# Patient Record
Sex: Female | Born: 1970 | Race: White | Hispanic: No | Marital: Married | Smoking: Never smoker
Health system: Southern US, Community
[De-identification: ages and names within clinical notes are randomized; demographics above are authoritative.]

## PROBLEM LIST (undated history)

## (undated) DIAGNOSIS — E079 Disorder of thyroid, unspecified: Secondary | ICD-10-CM

## (undated) DIAGNOSIS — R87619 Unspecified abnormal cytological findings in specimens from cervix uteri: Secondary | ICD-10-CM

## (undated) HISTORY — PX: INTRAUTERINE DEVICE INSERTION: SHX323

## (undated) HISTORY — DX: Unspecified abnormal cytological findings in specimens from cervix uteri: R87.619

## (undated) HISTORY — DX: Disorder of thyroid, unspecified: E07.9

---

## 2007-04-17 ENCOUNTER — Other Ambulatory Visit: Admission: RE | Admit: 2007-04-17 | Discharge: 2007-04-17 | Payer: Self-pay | Admitting: Obstetrics & Gynecology

## 2008-04-30 ENCOUNTER — Other Ambulatory Visit: Admission: RE | Admit: 2008-04-30 | Discharge: 2008-04-30 | Payer: Self-pay | Admitting: Obstetrics & Gynecology

## 2011-07-09 ENCOUNTER — Other Ambulatory Visit: Payer: Self-pay | Admitting: Certified Nurse Midwife

## 2011-07-09 DIAGNOSIS — E041 Nontoxic single thyroid nodule: Secondary | ICD-10-CM

## 2011-07-14 ENCOUNTER — Other Ambulatory Visit: Payer: Self-pay

## 2011-07-15 ENCOUNTER — Ambulatory Visit
Admission: RE | Admit: 2011-07-15 | Discharge: 2011-07-15 | Disposition: A | Discharge status: Home / Self Care | Payer: BC Managed Care – PPO | Source: Ambulatory Visit | Attending: Certified Nurse Midwife | Admitting: Certified Nurse Midwife

## 2011-07-15 DIAGNOSIS — E041 Nontoxic single thyroid nodule: Secondary | ICD-10-CM

## 2012-07-12 ENCOUNTER — Ambulatory Visit: Payer: Self-pay | Admitting: Certified Nurse Midwife

## 2012-07-24 ENCOUNTER — Other Ambulatory Visit: Payer: Self-pay | Admitting: Certified Nurse Midwife

## 2012-09-11 ENCOUNTER — Encounter: Payer: Self-pay | Admitting: Certified Nurse Midwife

## 2012-09-12 ENCOUNTER — Ambulatory Visit (INDEPENDENT_AMBULATORY_CARE_PROVIDER_SITE_OTHER): Payer: BC Managed Care – PPO | Admitting: Certified Nurse Midwife

## 2012-09-12 ENCOUNTER — Encounter: Payer: Self-pay | Admitting: Certified Nurse Midwife

## 2012-09-12 VITALS — BP 104/64 | HR 64 | Resp 16 | Ht 61.25 in | Wt 183.0 lb

## 2012-09-12 DIAGNOSIS — Z0000 Encounter for general adult medical examination without abnormal findings: Secondary | ICD-10-CM

## 2012-09-12 DIAGNOSIS — E039 Hypothyroidism, unspecified: Secondary | ICD-10-CM

## 2012-09-12 DIAGNOSIS — R6889 Other general symptoms and signs: Secondary | ICD-10-CM

## 2012-09-12 DIAGNOSIS — Z01419 Encounter for gynecological examination (general) (routine) without abnormal findings: Secondary | ICD-10-CM

## 2012-09-12 LAB — CBC
MCH: 28.6 pg (ref 26.0–34.0)
MCHC: 33.4 g/dL (ref 30.0–36.0)
Platelets: 239 10*3/uL (ref 150–400)
RBC: 4.61 MIL/uL (ref 3.87–5.11)
RDW: 14.1 % (ref 11.5–15.5)

## 2012-09-12 LAB — COMPREHENSIVE METABOLIC PANEL
AST: 19 U/L (ref 0–37)
Albumin: 4.4 g/dL (ref 3.5–5.2)
Alkaline Phosphatase: 100 U/L (ref 39–117)
BUN: 8 mg/dL (ref 6–23)
Calcium: 9.3 mg/dL (ref 8.4–10.5)
Chloride: 105 mEq/L (ref 96–112)
Creat: 1.05 mg/dL (ref 0.50–1.10)
Glucose, Bld: 72 mg/dL (ref 70–99)
Potassium: 4.6 mEq/L (ref 3.5–5.3)

## 2012-09-12 LAB — POCT URINALYSIS DIPSTICK
Bilirubin, UA: NEGATIVE
Leukocytes, UA: NEGATIVE
Nitrite, UA: NEGATIVE
Protein, UA: NEGATIVE
Urobilinogen, UA: NEGATIVE
pH, UA: 5

## 2012-09-12 LAB — LIPID PANEL
HDL: 36 mg/dL — ABNORMAL LOW (ref >39)
Total CHOL/HDL Ratio: 4 Ratio
Triglycerides: 119 mg/dL (ref <150)

## 2012-09-12 NOTE — Progress Notes (Signed)
42 y.o. G24P1001 Married Caucasian Fe here for annual exam.    No LMP recorded.          Sexually active: yes  The current method of family planning is IUD.    Exercising: no  exercising Smoker:  no  Health Maintenance: Pap: 06/29/11 neg HPV HR neg MMG:  06/28/11 Colonoscopy:  none BMD:   none TDaP:  2005 Labs: Poct urine-neg Self breast exam: occ     No past medical history on file.  Past Surgical History  Procedure Laterality Date  . Intrauterine device insertion      inserted 02/02/11, removal 02/01/21    Current Outpatient Prescriptions  Medication Sig Dispense Refill  . levothyroxine (SYNTHROID, LEVOTHROID) 50 MCG tablet TAKE 1 TABLET EVERY DAY  30 tablet  4  . loratadine (CLARITIN) 10 MG tablet Take 10 mg by mouth daily.      Marland Kitchen PARAGARD INTRAUTERINE COPPER IU by Intrauterine route.      . Pseudoephedrine HCl (SUDAFED PO) Take by mouth as needed.       No current facility-administered medications for this visit.    Family History  Problem Relation Age of Onset  . Breast cancer Mother   . Heart disease Mother   . Heart disease Father   . Breast cancer Maternal Aunt   . Heart disease Maternal Grandmother     ROS:  Pertinent items are noted in HPI.  Otherwise, a comprehensive ROS was negative.  Exam:   There were no vitals taken for this visit.    Ht Readings from Last 3 Encounters:  No data found for Ht    General appearance: alert, cooperative and appears stated age Head: Normocephalic, without obvious abnormality, atraumatic Neck: no adenopathy, supple, symmetrical, trachea midline and thyroid normal to inspection and palpation, small known nodule noted on Left no change Lungs: clear to auscultation bilaterally Breasts: normal appearance, no masses or tenderness, No nipple retraction or dimpling, No nipple discharge or bleeding, No axillary or supraclavicular adenopathy Heart: regular rate and rhythm Abdomen: soft, non-tender; no masses,  no  organomegaly Extremities: extremities normal, atraumatic, no cyanosis or edema Skin: Skin color, texture, turgor normal. No rashes or lesions Lymph nodes: Cervical, supraclavicular, and axillary nodes normal. No abnormal inguinal nodes palpated Neurologic: Grossly normal   Pelvic: External genitalia:  no lesions              Urethra:  normal appearing urethra with no masses, tenderness or lesions              Bartholin's and Skene's: normal                 Vagina: normal appearing vagina with normal color and discharge, no lesions              Cervix: normal, nontender, IUD string noted              Pap taken: no Bimanual Exam:  Uterus:  normal size, contour, position, consistency, mobility, non-tender and mid position              Adnexa: normal adnexa and no mass, fullness, tenderness               Rectovaginal: Confirms               Anus:  normal sphincter tone, no lesions  A:  Well Woman with normal exam  Contraception: Paragard IUD  Hypothyroid stable medication, Left thyroid nodule, no size change from  previous exam  P:  Reviewed health and wellness pertinent to exam  Aware of warning signs and symptoms with IUD  Has Rx will hold on refill until TSH results in   Labs: Lipid panel, CMP, CBC  Pap smear as per guidelines   Mammogram yearly pap smear not taken today  counseled on breast self exam, mammography screening, adequate intake of calcium and vitamin D, diet and exercise  return annually or prn  An After Visit Summary was printed and given to the patient.  Reviewed, TL

## 2012-09-12 NOTE — Patient Instructions (Addendum)

## 2012-09-13 ENCOUNTER — Other Ambulatory Visit: Payer: Self-pay | Admitting: Certified Nurse Midwife

## 2012-09-13 ENCOUNTER — Telehealth: Payer: Self-pay

## 2012-09-13 DIAGNOSIS — E039 Hypothyroidism, unspecified: Secondary | ICD-10-CM

## 2012-09-13 DIAGNOSIS — R6889 Other general symptoms and signs: Secondary | ICD-10-CM

## 2012-09-13 NOTE — Telephone Encounter (Signed)
Lmtcb//jj 

## 2012-09-13 NOTE — Progress Notes (Deleted)
I have put them in twice and didn't hold, just repeat them in again , will recheck

## 2012-09-13 NOTE — Telephone Encounter (Signed)
Patient notified

## 2012-09-13 NOTE — Telephone Encounter (Signed)
Message copied by Eliezer Bottom on Wed Sep 13, 2012 10:32 AM ------      Message from: Verner Chol      Created: Wed Sep 13, 2012  8:02 AM       Notify that CBC is essentially normal but white count just slightly elevated, could be viral response re check with TSH unless becomes symptomatic with illness signs in one month      TSH is elevated, not unusual with age changes, need to increase Synthroid to 75 mcg daily, if she still has some 50 mcg can do one and 1/2 until finishes that Rx, If not Rx Synthroid 75 mcg #30 one refill recheck in one month      Order for lab only in      Liver, kidney, glucose profile normal      Lipid panel all normal except  HDL( good cholesterol low)   Work on weight loss and diet and this will improve the cardiac ratio profile ------

## 2012-09-13 NOTE — Telephone Encounter (Signed)
Pt returning your call

## 2012-09-26 ENCOUNTER — Telehealth: Payer: Self-pay | Admitting: Certified Nurse Midwife

## 2012-09-26 MED ORDER — LEVOTHYROXINE SODIUM 75 MCG PO TABS: ORAL_TABLET | ORAL | Status: DC

## 2012-09-26 NOTE — Telephone Encounter (Signed)
rx sent thru for 1 mth with no refills. Patient has lab appt for recheck on 10/13/12

## 2012-09-26 NOTE — Telephone Encounter (Signed)
Patient needs have her levothyroxine called in and the dosage changed to 75mg .

## 2012-09-26 NOTE — Telephone Encounter (Signed)
Patient notified

## 2012-10-12 ENCOUNTER — Other Ambulatory Visit: Payer: BC Managed Care – PPO | Admitting: Certified Nurse Midwife

## 2012-10-12 ENCOUNTER — Ambulatory Visit (INDEPENDENT_AMBULATORY_CARE_PROVIDER_SITE_OTHER): Payer: BC Managed Care – PPO | Admitting: Certified Nurse Midwife

## 2012-10-12 DIAGNOSIS — R6889 Other general symptoms and signs: Secondary | ICD-10-CM

## 2012-10-12 DIAGNOSIS — E039 Hypothyroidism, unspecified: Secondary | ICD-10-CM

## 2012-10-13 ENCOUNTER — Other Ambulatory Visit: Payer: BC Managed Care – PPO

## 2012-10-13 LAB — TSH: TSH: 2.003 u[IU]/mL (ref 0.350–4.500)

## 2012-10-13 LAB — CBC
HCT: 38.7 % (ref 36.0–46.0)
Hemoglobin: 12.7 g/dL (ref 12.0–15.0)
MCH: 28.8 pg (ref 26.0–34.0)
MCHC: 32.8 g/dL (ref 30.0–36.0)
RDW: 14.4 % (ref 11.5–15.5)

## 2012-10-17 ENCOUNTER — Other Ambulatory Visit: Payer: Self-pay | Admitting: Certified Nurse Midwife

## 2012-10-17 ENCOUNTER — Telehealth: Payer: Self-pay

## 2012-10-17 DIAGNOSIS — R6889 Other general symptoms and signs: Secondary | ICD-10-CM

## 2012-10-17 DIAGNOSIS — E039 Hypothyroidism, unspecified: Secondary | ICD-10-CM

## 2012-10-17 MED ORDER — LEVOTHYROXINE SODIUM 75 MCG PO TABS: ORAL_TABLET | ORAL | Status: DC

## 2012-10-17 NOTE — Telephone Encounter (Signed)
Message copied by Elisha Headland on Tue Oct 17, 2012 10:01 AM ------      Message from: Leah Estrada      Created: Tue Oct 17, 2012  7:38 AM       Notify patient Synthroid 75 mcg appears to be working well . TSH good range now. (does she need refill?)      CBC essentially normal      Repeat TSH and CBC with diff in 2 months  Order in ------

## 2012-10-17 NOTE — Telephone Encounter (Signed)
7/22 lmtcb//kn

## 2012-10-17 NOTE — Telephone Encounter (Signed)
Patient notified of all results. Will call back to schedule recheck of labs, she needs to check her work schedule. Will need refills of synthroid-rx for #30/1RF sent to [facility]. This will last her until her f/u labs.

## 2012-12-24 ENCOUNTER — Other Ambulatory Visit: Payer: Self-pay | Admitting: Certified Nurse Midwife

## 2012-12-25 ENCOUNTER — Other Ambulatory Visit (INDEPENDENT_AMBULATORY_CARE_PROVIDER_SITE_OTHER): Payer: BC Managed Care – PPO

## 2012-12-25 DIAGNOSIS — E039 Hypothyroidism, unspecified: Secondary | ICD-10-CM

## 2012-12-25 DIAGNOSIS — R6889 Other general symptoms and signs: Secondary | ICD-10-CM

## 2012-12-25 LAB — TSH: TSH: 1.848 u[IU]/mL (ref 0.350–4.500)

## 2012-12-25 LAB — CBC WITH DIFFERENTIAL/PLATELET
Basophils Absolute: 0 10*3/uL (ref 0.0–0.1)
Basophils Relative: 0 % (ref 0–1)
HCT: 38.6 % (ref 36.0–46.0)
Hemoglobin: 12.9 g/dL (ref 12.0–15.0)
Lymphocytes Relative: 29 % (ref 12–46)
Monocytes Absolute: 0.6 10*3/uL (ref 0.1–1.0)
Monocytes Relative: 7 % (ref 3–12)
Neutro Abs: 5.4 10*3/uL (ref 1.7–7.7)
Neutrophils Relative %: 60 % (ref 43–77)
WBC: 9 10*3/uL (ref 4.0–10.5)

## 2012-12-26 ENCOUNTER — Other Ambulatory Visit: Payer: Self-pay | Admitting: Certified Nurse Midwife

## 2012-12-26 DIAGNOSIS — E039 Hypothyroidism, unspecified: Secondary | ICD-10-CM

## 2012-12-27 ENCOUNTER — Other Ambulatory Visit: Payer: Self-pay | Admitting: Certified Nurse Midwife

## 2012-12-27 DIAGNOSIS — E039 Hypothyroidism, unspecified: Secondary | ICD-10-CM

## 2012-12-27 MED ORDER — LEVOTHYROXINE SODIUM 75 MCG PO TABS: ORAL_TABLET | ORAL | Status: DC

## 2013-01-29 IMAGING — US US SOFT TISSUE HEAD/NECK
1 series · 14 of 25 positions shown · non-contrast
Comparison: None.

CLINICAL DATA: Left thyroid nodule, hypothyroid

THYROID ULTRASOUND
TECHNIQUE: Ultrasound examination of the thyroid gland and adjacent
soft tissues was performed.

[Series 1: us soft tissue head/neck · 0.08mm/px · 14 of 53 slices shown]
[im 1/53]
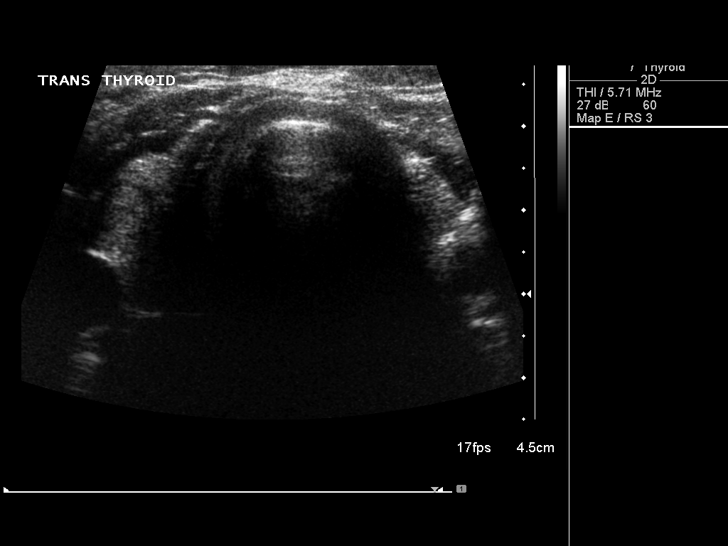
[im 5/53]
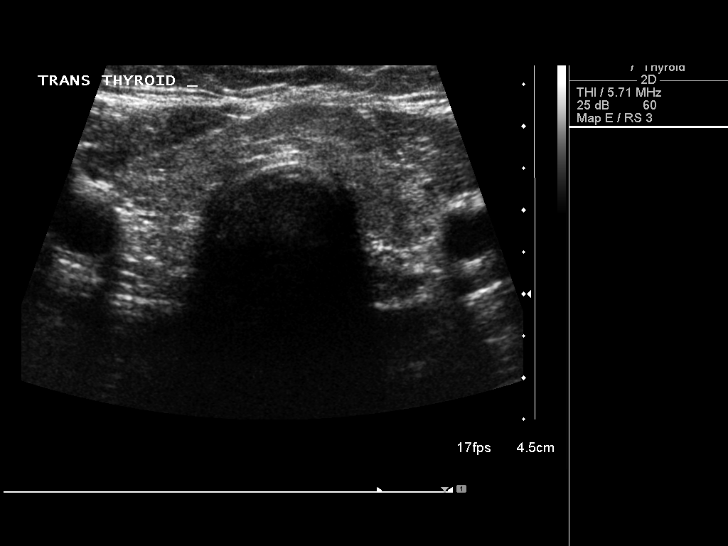
[im 9/53]
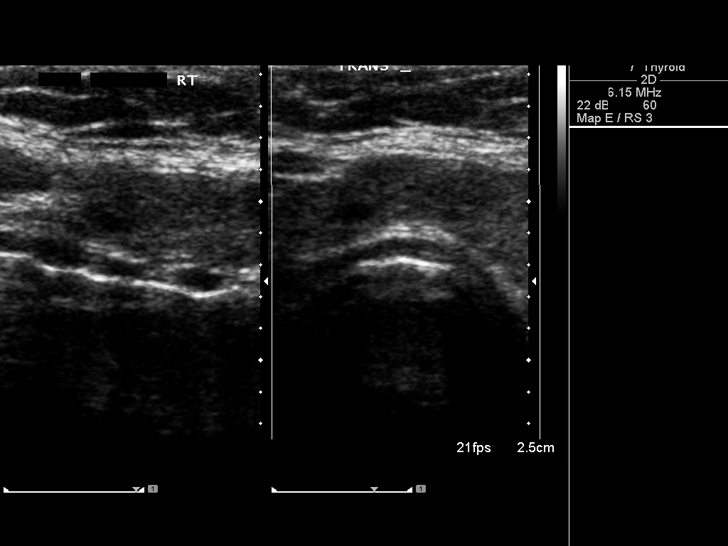
[im 14/53]
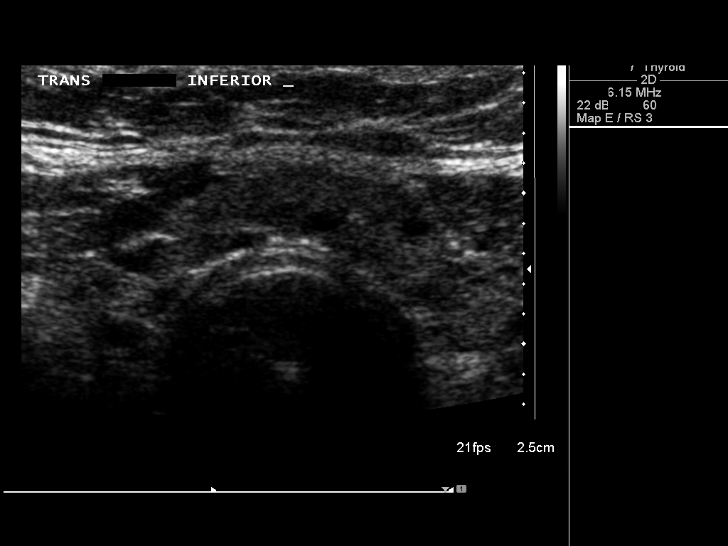
[im 18/53]
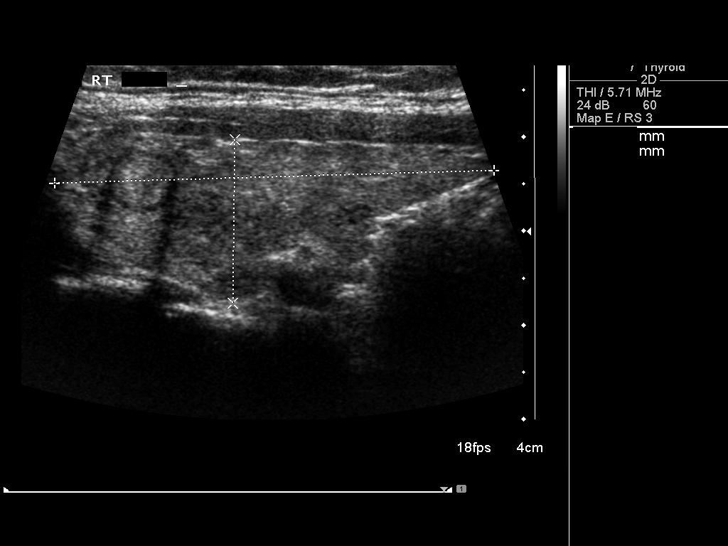
[im 20/53]
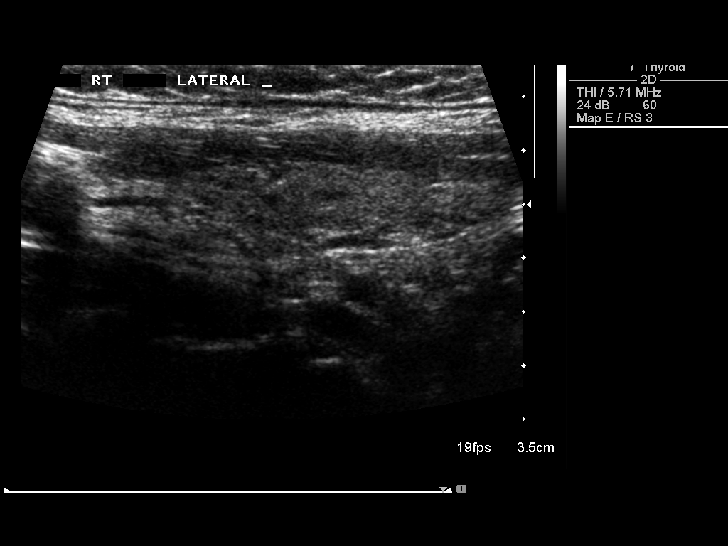
[im 24/53]
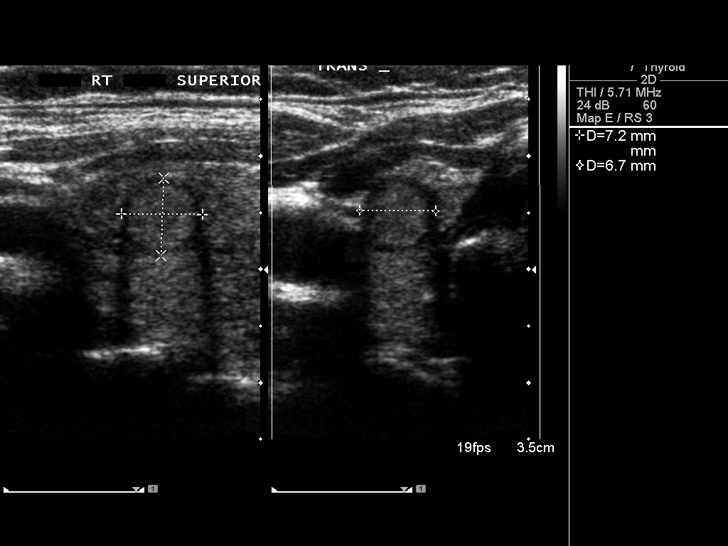
[im 29/53]
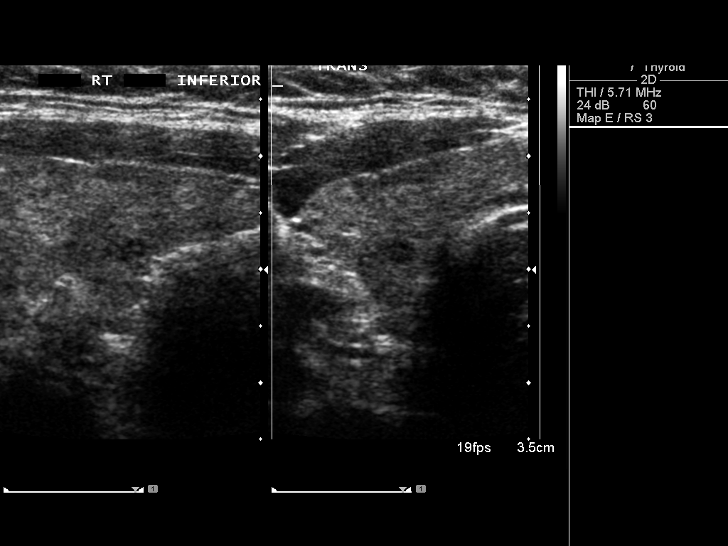
[im 33/53]
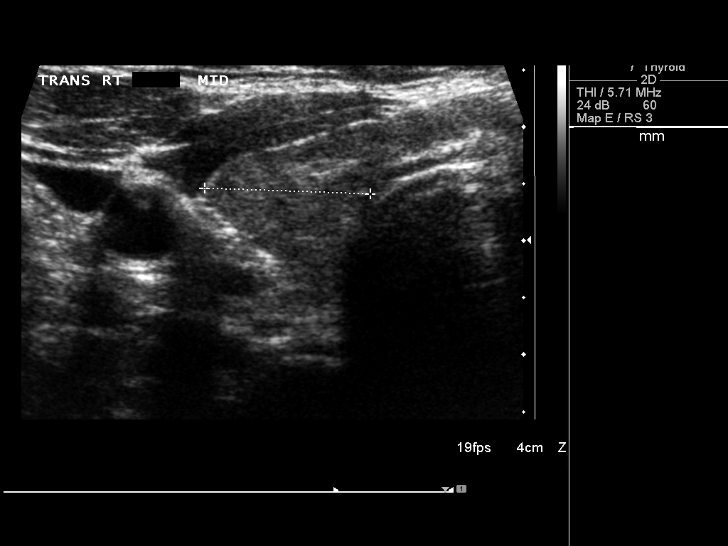
[im 35/53]
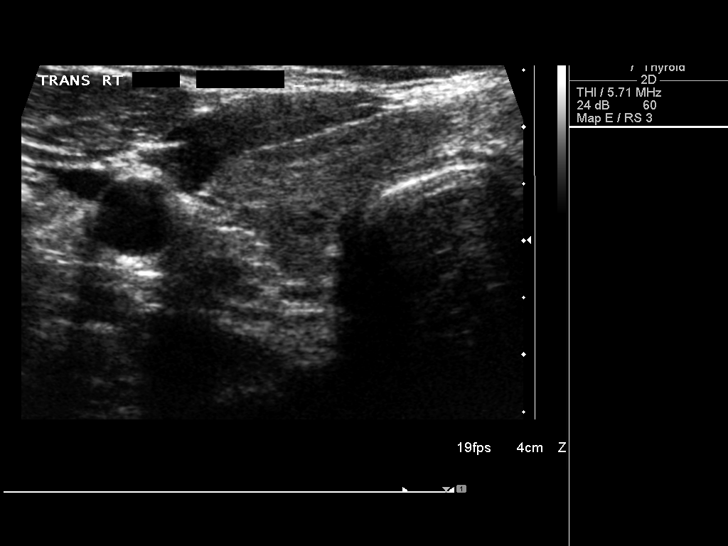
[im 40/53]
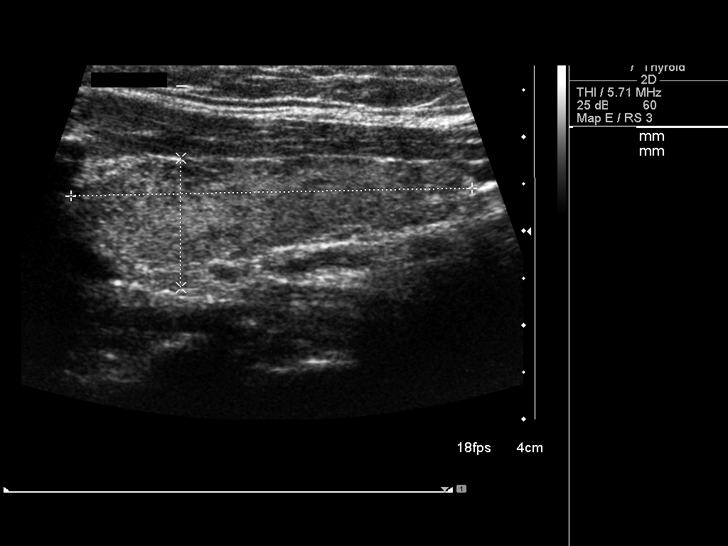
[im 44/53]
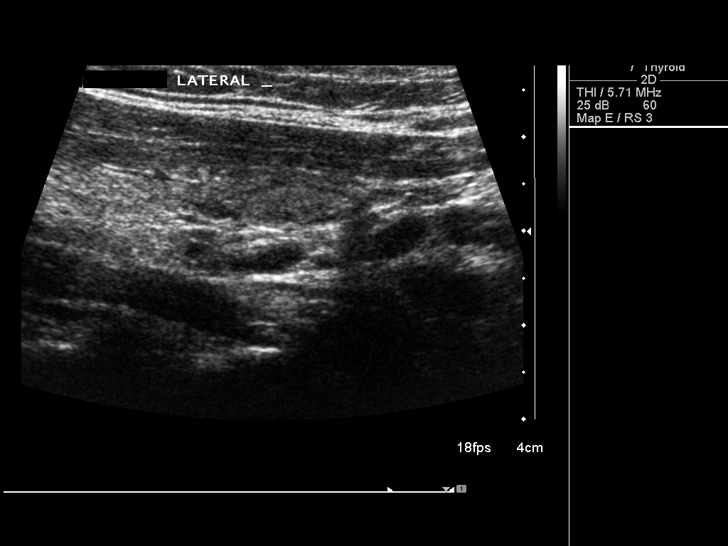
[im 48/53]
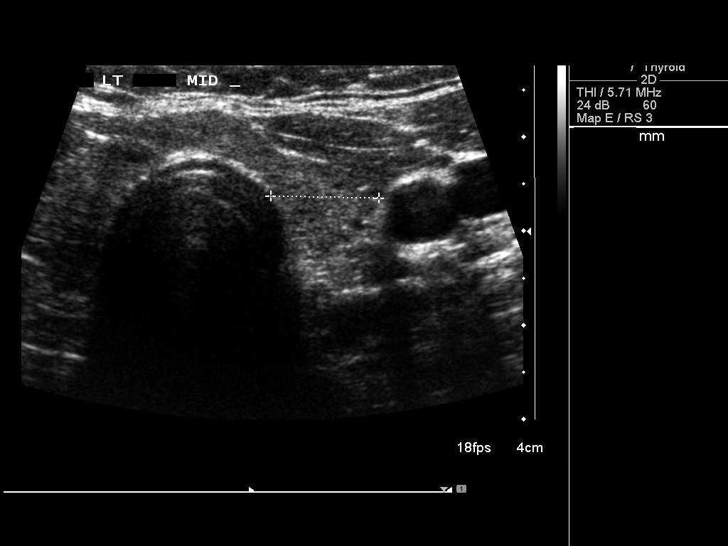
[im 53/53]
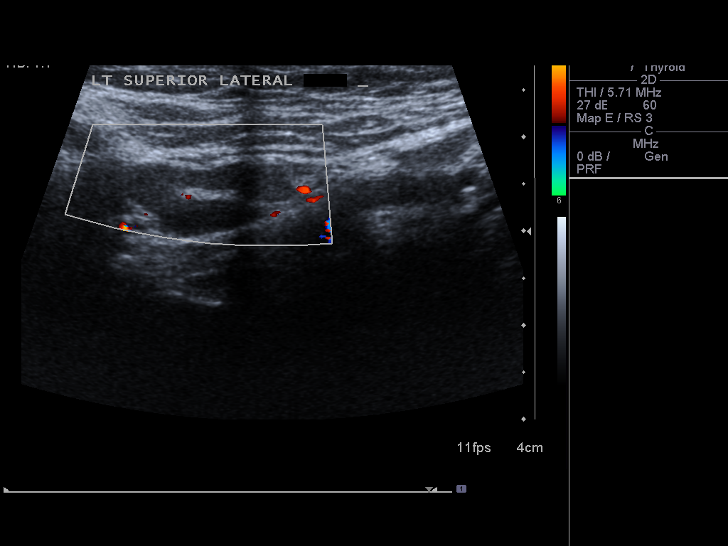

[14 of 25 positions shown; findings below may reference images not displayed]

FINDINGS: Right thyroid lobe:  4.7 x 1.7 x 1.5 cm.
Left thyroid lobe:  4.3 x 1.4 x 1.2 cm.
Isthmus:  6 mm in thickness.

Focal nodules:  The echogenicity of the thyroid gland is slightly
inhomogeneous.  There are nodules particularly in the right upper
lobe of no more than 10 mm in diameter.  Small nodules are present
bilaterally of 4 mm or less in size.

Lymphadenopathy:  None visualized.
IMPRESSION: There are multiple small thyroid nodules the largest of 10 mm in
the right upper lobe.  The gland is normal in size.

## 2013-02-21 ENCOUNTER — Other Ambulatory Visit (INDEPENDENT_AMBULATORY_CARE_PROVIDER_SITE_OTHER): Payer: BC Managed Care – PPO

## 2013-02-21 DIAGNOSIS — E039 Hypothyroidism, unspecified: Secondary | ICD-10-CM

## 2013-02-21 LAB — TSH: TSH: 1.863 u[IU]/mL (ref 0.350–4.500)

## 2013-02-23 ENCOUNTER — Other Ambulatory Visit: Payer: Self-pay | Admitting: Certified Nurse Midwife

## 2013-02-23 DIAGNOSIS — E039 Hypothyroidism, unspecified: Secondary | ICD-10-CM

## 2013-02-23 MED ORDER — LEVOTHYROXINE SODIUM 75 MCG PO TABS: ORAL_TABLET | ORAL | Status: DC

## 2013-02-27 ENCOUNTER — Telehealth: Payer: Self-pay

## 2013-02-27 NOTE — Telephone Encounter (Signed)
Message copied by Eliezer Bottom on Tue Feb 27, 2013  2:29 PM ------      Message from: Verner Chol      Created: Fri Feb 23, 2013 12:06 PM       Notify TSH looks great on current dose, stable now will renew dose order in      Re check at [facility] ------

## 2013-02-27 NOTE — Telephone Encounter (Signed)
Patient notified of results.

## 2013-02-27 NOTE — Telephone Encounter (Signed)
lmtcb

## 2013-02-27 NOTE — Telephone Encounter (Signed)
Returning a call to Joy. °

## 2013-08-14 ENCOUNTER — Encounter: Payer: Self-pay | Admitting: Certified Nurse Midwife

## 2013-09-13 ENCOUNTER — Ambulatory Visit: Payer: BC Managed Care – PPO | Admitting: Certified Nurse Midwife

## 2013-09-25 ENCOUNTER — Ambulatory Visit (INDEPENDENT_AMBULATORY_CARE_PROVIDER_SITE_OTHER): Payer: BC Managed Care – PPO | Admitting: Certified Nurse Midwife

## 2013-09-25 ENCOUNTER — Encounter: Payer: Self-pay | Admitting: Certified Nurse Midwife

## 2013-09-25 ENCOUNTER — Other Ambulatory Visit: Payer: Self-pay | Admitting: Certified Nurse Midwife

## 2013-09-25 VITALS — BP 110/70 | HR 68 | Resp 16 | Ht 61.0 in | Wt 187.0 lb

## 2013-09-25 DIAGNOSIS — Z0000 Encounter for general adult medical examination without abnormal findings: Secondary | ICD-10-CM

## 2013-09-25 DIAGNOSIS — Z01419 Encounter for gynecological examination (general) (routine) without abnormal findings: Secondary | ICD-10-CM

## 2013-09-25 DIAGNOSIS — L723 Sebaceous cyst: Secondary | ICD-10-CM

## 2013-09-25 DIAGNOSIS — Z124 Encounter for screening for malignant neoplasm of cervix: Secondary | ICD-10-CM

## 2013-09-25 DIAGNOSIS — E039 Hypothyroidism, unspecified: Secondary | ICD-10-CM

## 2013-09-25 LAB — POCT URINALYSIS DIPSTICK
Bilirubin, UA: NEGATIVE
GLUCOSE UA: NEGATIVE
Ketones, UA: NEGATIVE
Leukocytes, UA: NEGATIVE
Nitrite, UA: NEGATIVE
Protein, UA: NEGATIVE
RBC UA: NEGATIVE
UROBILINOGEN UA: NEGATIVE
pH, UA: 5

## 2013-09-25 NOTE — Patient Instructions (Signed)
General topics  Next pap or exam is  due in 1 year Take a Women's multivitamin Take 1200 mg. of calcium daily - prefer dietary If any concerns in interim to call back  Breast Self-Awareness Practicing breast self-awareness may pick up problems early, prevent significant medical complications, and possibly save your life. By practicing breast self-awareness, you can become familiar with how your breasts look and feel and if your breasts are changing. This allows you to notice changes early. It can also offer you some reassurance that your breast health is good. One way to learn what is normal for your breasts and whether your breasts are changing is to do a breast self-exam. If you find a lump or something that was not present in the past, it is best to contact your caregiver right away. Other findings that should be evaluated by your caregiver include nipple discharge, especially if it is bloody; skin changes or reddening; areas where the skin seems to be pulled in (retracted); or new lumps and bumps. Breast pain is seldom associated with cancer (malignancy), but should also be evaluated by a caregiver. BREAST SELF-EXAM The best time to examine your breasts is 5 7 days after your menstrual period is over.  ExitCare Patient Information 2013 [employer].   Exercise to Stay Healthy Exercise helps you become and stay healthy. EXERCISE IDEAS AND TIPS Choose exercises that:  You enjoy.  Fit into your day. You do not need to exercise really hard to be healthy. You can do exercises at a slow or medium level and stay healthy. You can:  Stretch before and after working out.  Try yoga, Pilates, or tai chi.  Lift weights.  Walk fast, swim, jog, run, climb stairs, bicycle, dance, or rollerskate.  Take aerobic classes. Exercises that burn about 150 calories:  Running 1  miles in 15 minutes.  Playing volleyball for 45 to 60 minutes.  Washing and waxing a car for 45 to 60  minutes.  Playing touch football for 45 minutes.  Walking 1  miles in 35 minutes.  Pushing a stroller 1  miles in 30 minutes.  Playing basketball for 30 minutes.  Raking leaves for 30 minutes.  Bicycling 5 miles in 30 minutes.  Walking 2 miles in 30 minutes.  Dancing for 30 minutes.  Shoveling snow for 15 minutes.  Swimming laps for 20 minutes.  Walking up stairs for 15 minutes.  Bicycling 4 miles in 15 minutes.  Gardening for 30 to 45 minutes.  Jumping rope for 15 minutes.  Washing windows or floors for 45 to 60 minutes. Document Released: 04/17/2010 Document Revised: 06/07/2011 Document Reviewed: 04/17/2010 ExitCare Patient Information 2013 [employer].   Other topics ( that may be useful information):    Sexually Transmitted Disease Sexually transmitted disease (STD) refers to any infection that is passed from person to person during sexual activity. This may happen by way of saliva, semen, blood, vaginal mucus, or urine. Common STDs include:  Gonorrhea.  Chlamydia.  Syphilis.  HIV/AIDS.  Genital herpes.  Hepatitis B and C.  Trichomonas.  Human papillomavirus (HPV).  Pubic lice. CAUSES  An STD may be spread by bacteria, virus, or parasite. A person can get an STD by:  Sexual intercourse with an infected person.  Sharing sex toys with an infected person.  Sharing needles with an infected person.  Having intimate contact with the genitals, mouth, or rectal areas of an infected person. SYMPTOMS  Some people may not have any symptoms, but   they can still pass the infection to others. Different STDs have different symptoms. Symptoms include:  Painful or bloody urination.  Pain in the pelvis, abdomen, vagina, anus, throat, or eyes.  Skin rash, itching, irritation, growths, or sores (lesions). These usually occur in the genital or anal area.  Abnormal vaginal discharge.  Penile discharge in men.  Soft, flesh-colored skin growths in the  genital or anal area.  Fever.  Pain or bleeding during sexual intercourse.  Swollen glands in the groin area.  Yellow skin and eyes (jaundice). This is seen with hepatitis. DIAGNOSIS  To make a diagnosis, your caregiver may:  Take a medical history.  Perform a physical exam.  Take a specimen (culture) to be examined.  Examine a sample of discharge under a microscope.  Perform blood test TREATMENT   Chlamydia, gonorrhea, trichomonas, and syphilis can be cured with antibiotic medicine.  Genital herpes, hepatitis, and HIV can be treated, but not cured, with prescribed medicines. The medicines will lessen the symptoms.  Genital warts from HPV can be treated with medicine or by freezing, burning (electrocautery), or surgery. Warts may come back.  HPV is a virus and cannot be cured with medicine or surgery.However, abnormal areas may be followed very closely by your caregiver and may be removed from the cervix, vagina, or vulva through office procedures or surgery. If your diagnosis is confirmed, your recent sexual partners need treatment. This is true even if they are symptom-free or have a negative culture or evaluation. They should not have sex until their caregiver says it is okay. HOME CARE INSTRUCTIONS  All sexual partners should be informed, tested, and treated for all STDs.  Take your antibiotics as directed. Finish them even if you start to feel better.  Only take over-the-counter or prescription medicines for pain, discomfort, or fever as directed by your caregiver.  Rest.  Eat a balanced diet and drink enough fluids to keep your urine clear or pale yellow.  Do not have sex until treatment is completed and you have followed up with your caregiver. STDs should be checked after treatment.  Keep all follow-up appointments, Pap tests, and blood tests as directed by your caregiver.  Only use latex condoms and water-soluble lubricants during sexual activity. Do not use  petroleum jelly or oils.  Avoid alcohol and illegal drugs.  Get vaccinated for HPV and hepatitis. If you have not received these vaccines in the past, talk to your caregiver about whether one or both might be right for you.  Avoid risky sex practices that can break the skin. The only way to avoid getting an STD is to avoid all sexual activity.Latex condoms and dental dams (for oral sex) will help lessen the risk of getting an STD, but will not completely eliminate the risk. SEEK MEDICAL CARE IF:   You have a fever.  You have any new or worsening symptoms. Document Released: 06/05/2002 Document Revised: 06/07/2011 Document Reviewed: 06/12/2010 [facility] Patient Information 2013 [employer].    Domestic Abuse You are being battered or abused if someone close to you hits, pushes, or physically hurts you in any way. You also are being abused if you are forced into activities. You are being sexually abused if you are forced to have sexual contact of any kind. You are being emotionally abused if you are made to feel worthless or if you are constantly threatened. It is important to remember that help is available. No one has the right to abuse you. PREVENTION OF FURTHER  ABUSE  Learn the warning signs of danger. This varies with situations but may include: the use of alcohol, threats, isolation from friends and family, or forced sexual contact. Leave if you feel that violence is going to occur.  If you are attacked or beaten, report it to the police so the abuse is documented. You do not have to press charges. The police can protect you while you or the attackers are leaving. Get the officer's name and badge number and a copy of the report.  Find someone you can trust and tell them what is happening to you: your caregiver, a nurse, clergy member, close friend or family member. Feeling ashamed is natural, but remember that you have done nothing wrong. No one deserves abuse. Document Released:  03/12/2000 Document Revised: 06/07/2011 Document Reviewed: 05/21/2010 ExitCare Patient Information 2013 [employer].    How Much is Too Much Alcohol? Drinking too much alcohol can cause injury, accidents, and health problems. These types of problems can include:   Car crashes.  Falls.  Family fighting (domestic violence).  Drowning.  Fights.  Injuries.  Burns.  Damage to certain organs.  Having a baby with birth defects. ONE DRINK CAN BE TOO MUCH WHEN YOU ARE:  Working.  Pregnant or breastfeeding.  Taking medicines. Ask your doctor.  Driving or planning to drive. If you or someone you know has a drinking problem, get help from a doctor.  Document Released: 01/09/2009 Document Revised: 06/07/2011 Document Reviewed: 01/09/2009 ExitCare Patient Information 2013 [employer].   Smoking Hazards Smoking cigarettes is extremely bad for your health. Tobacco smoke has over 200 known poisons in it. There are over 60 chemicals in tobacco smoke that cause cancer. Some of the chemicals found in cigarette smoke include:   Cyanide.  Benzene.  Formaldehyde.  Methanol (wood alcohol).  Acetylene (fuel used in welding torches).  Ammonia. Cigarette smoke also contains the poisonous gases nitrogen oxide and carbon monoxide.  Cigarette smokers have an increased risk of many serious medical problems and Smoking causes approximately:  90% of all lung cancer deaths in men.  80% of all lung cancer deaths in women.  90% of deaths from chronic obstructive lung disease. Compared with nonsmokers, smoking increases the risk of:  Coronary heart disease by 2 to 4 times.  Stroke by 2 to 4 times.  Men developing lung cancer by 23 times.  Women developing lung cancer by 13 times.  Dying from chronic obstructive lung diseases by 12 times.  . Smoking is the most preventable cause of death and disease in our society.  WHY IS SMOKING ADDICTIVE?  Nicotine is the chemical  agent in tobacco that is capable of causing addiction or dependence.  When you smoke and inhale, nicotine is absorbed rapidly into the bloodstream through your lungs. Nicotine absorbed through the lungs is capable of creating a powerful addiction. Both inhaled and non-inhaled nicotine may be addictive.  Addiction studies of cigarettes and spit tobacco show that addiction to nicotine occurs mainly during the teen years, when young people begin using tobacco products. WHAT ARE THE BENEFITS OF QUITTING?  There are many health benefits to quitting smoking.   Likelihood of developing cancer and heart disease decreases. Health improvements are seen almost immediately.  Blood pressure, pulse rate, and breathing patterns start returning to normal soon after quitting. QUITTING SMOKING   American Lung Association - 1-800-LUNGUSA  American Cancer Society - 1-800-ACS-2345 Document Released: 04/22/2004 Document Revised: 06/07/2011 Document Reviewed: 12/25/2008 ExitCare Patient Information 2013 ExitCare,   LL[employer].   Stress Management Stress is a state of physical or mental tension that often results from changes in your life or normal routine. Some common causes of stress are:  Death of a loved one.  Injuries or severe illnesses.  Getting fired or changing jobs.  Moving into a new home. Other causes may be:  Sexual problems.  Business or financial losses.  Taking on a large debt.  Regular conflict with someone at home or at work.  Constant tiredness from lack of sleep. It is not just bad things that are stressful. It may be stressful to:  Win the lottery.  Get married.  Buy a new car. The amount of stress that can be easily tolerated varies from person to person. Changes generally cause stress, regardless of the types of change. Too much stress can affect your health. It may lead to physical or emotional problems. Too little stress (boredom) may also become stressful. SUGGESTIONS TO  REDUCE STRESS:  Talk things over with your family and friends. It often is helpful to share your concerns and worries. If you feel your problem is serious, you may want to get help from a professional counselor.  Consider your problems one at a time instead of lumping them all together. Trying to take care of everything at once may seem impossible. List all the things you need to do and then start with the most important one. Set a goal to accomplish 2 or 3 things each day. If you expect to do too many in a single day you will naturally fail, causing you to feel even more stressed.  Do not use alcohol or drugs to relieve stress. Although you may feel better for a short time, they do not remove the problems that caused the stress. They can also be habit forming.  Exercise regularly - at least 3 times per week. Physical exercise can help to relieve that "uptight" feeling and will relax you.  The shortest distance between despair and hope is often a good night's sleep.  Go to bed and get up on time allowing yourself time for appointments without being rushed.  Take a short "time-out" period from any stressful situation that occurs during the day. Close your eyes and take some deep breaths. Starting with the muscles in your face, tense them, hold it for a few seconds, then relax. Repeat this with the muscles in your neck, shoulders, hand, stomach, back and legs.  Take good care of yourself. Eat a balanced diet and get plenty of rest.  Schedule time for having fun. Take a break from your daily routine to relax. HOME CARE INSTRUCTIONS   Call if you feel overwhelmed by your problems and feel you can no longer manage them on your own.  Return immediately if you feel like hurting yourself or someone else. Document Released: 09/08/2000 Document Revised: 06/07/2011 Document Reviewed: 05/01/2007 ExitCare Patient Information 2013 [employer].   

## 2013-09-25 NOTE — Progress Notes (Signed)
43 y.o. [id number] Married Caucasian Fe here for annual exam.  Periods normal, no issues. Contraception working well. Patient has been battling plantar fascitis and now has appointment with Podiatrist. No health issues in past year. Sees PCP prn only. Patient complaining of ? Ingrown hair on inner left thigh for several weeks, seems to resolve and reoccurs when she squeezes area. Has cleaned area with Peroxide with some decrease in occurrence. No fever or chills or drainage. Please check. Thyroid medication working well, no problems. No other health issues today.  Patient's last menstrual period was 09/02/2013.          Sexually active: Yes.    The current method of family planning is IUD.    Exercising: No.  exercise Smoker:  no  Health Maintenance: Pap: 06-29-11 neg HPV HR neg MMG:  09/25/13  Colonoscopy:  none BMD:   none TDaP:  2005 patient aware due Labs: Poct urine-neg Self breast exam:done occ   reports that she has never smoked. She does not have any smokeless tobacco history on file. She reports that she does not drink alcohol or use illicit drugs.  Past Medical History  Diagnosis Date  . Thyroid disease     hypothyroidism    Past Surgical History  Procedure Laterality Date  . Intrauterine device insertion      inserted 02/02/11, removal 02/01/21    Current Outpatient Prescriptions  Medication Sig Dispense Refill  . Fexofenadine HCl (ALLEGRA PO) Take by mouth daily.      Marland Kitchen. levothyroxine (SYNTHROID, LEVOTHROID) 75 MCG tablet TAKE 1 TABLET EVERY DAY  30 tablet  7  . PARAGARD INTRAUTERINE COPPER IU by Intrauterine route.      . Pseudoephedrine HCl (SUDAFED PO) Take by mouth as needed.       No current facility-administered medications for this visit.    Family History  Problem Relation Age of Onset  . Breast cancer Mother   . Heart disease Mother   . Heart disease Father   . Breast cancer Maternal Aunt   . Heart disease Maternal Grandmother     ROS:  Pertinent items  are noted in HPI.  Otherwise, a comprehensive ROS was negative.  Exam:   BP 110/70  Pulse 68  Resp 16  Ht 5\' 1"  (1.549 m)  Wt 187 lb (84.823 kg)  BMI 35.35 kg/m2  LMP 09/02/2013 Height: 5\' 1"  (154.9 cm)  Ht Readings from Last 3 Encounters:  09/25/13 5\' 1"  (1.549 m)  09/12/12 5' 1.25" (1.556 m)    General appearance: alert, cooperative and appears stated age Head: Normocephalic, without obvious abnormality, atraumatic Neck: no adenopathy, supple, symmetrical, trachea midline and thyroid normal to inspection and palpation and non-palpable Lungs: clear to auscultation bilaterally Breasts: normal appearance, no masses or tenderness, No nipple retraction or dimpling, No nipple discharge or bleeding, No axillary or supraclavicular adenopathy Heart: regular rate and rhythm Abdomen: soft, non-tender; no masses,  no organomegaly Extremities: extremities normal, atraumatic, no cyanosis or edema Skin: Skin color, texture, turgor normal. No rashes or lesions Lymph nodes: Cervical, supraclavicular, and axillary nodes normal. No abnormal inguinal nodes palpated Neurologic: Grossly normal   Pelvic: External genitalia:  no lesions  Inside left inner thigh near groin area small sebaceous cyst noted, not red, non tender, no pus noted              Urethra:  normal appearing urethra with no masses, tenderness or lesions  Bartholin's and Skene's: normal                 Vagina: normal appearing vagina with normal color and discharge, no lesions              Cervix: normal, non tender, IUD string noted              Pap taken: Yes.   Bimanual Exam:  Uterus:  normal size, contour, position, consistency, mobility, non-tender and anteverted              Adnexa: normal adnexa and no mass, fullness, tenderness               Rectovaginal: Confirms               Anus:  normal sphincter tone, no lesions  A:  Well Woman with normal exam  Contraception Paragard IUD  Sebaceous cyst, not  infected  Hypothyroid stable medication  Plantar fascitis has appointment for evaluation  Screening labs  Immunization update    P:   Reviewed health and wellness pertinent to exam  Aware of warnings with IUD, Due for removal 2022   Discussed findings and stressed don't squeeze area, use epsom salt soaks to area, and apply bactracin ointment to area if needed, should resolve. If becomes red or hot needs to come in to be evaluated  Labs:TSH,Lipid panel, Hgb A1c, Vitamin D  Will refill Synthroid when TSH in, has enough until then  Keep appointment.  Will obtain TDAP later  Pap smear taken today with HPV reflex   counseled on breast self exam, mammography screening, adequate intake of calcium and vitamin D, diet and exercise  return annually or prn  An After Visit Summary was printed and given to the patient.

## 2013-09-26 LAB — LIPID PANEL
CHOLESTEROL: 137 mg/dL (ref 0–200)
HDL: 37 mg/dL — ABNORMAL LOW (ref >39)
LDL Cholesterol: 78 mg/dL (ref 0–99)
Total CHOL/HDL Ratio: 3.7 Ratio
Triglycerides: 112 mg/dL (ref <150)
VLDL: 22 mg/dL (ref 0–40)

## 2013-09-26 LAB — TSH: TSH: 1.182 u[IU]/mL (ref 0.350–4.500)

## 2013-09-26 MED ORDER — LEVOTHYROXINE SODIUM 75 MCG PO TABS: ORAL_TABLET | ORAL | Status: DC

## 2013-09-27 LAB — VITAMIN D 25 HYDROXY (VIT D DEFICIENCY, FRACTURES): VIT D 25 HYDROXY: 44 ng/mL (ref 30–89)

## 2013-10-01 NOTE — Progress Notes (Signed)
Reviewed personally.  M. Suzanne Taos Tapp, MD.  

## 2013-10-03 LAB — IPS PAP TEST WITH REFLEX TO HPV

## 2013-10-18 ENCOUNTER — Ambulatory Visit: Payer: BC Managed Care – PPO | Admitting: Certified Nurse Midwife

## 2014-01-28 ENCOUNTER — Encounter: Payer: Self-pay | Admitting: Certified Nurse Midwife

## 2014-10-04 ENCOUNTER — Encounter: Payer: Self-pay | Admitting: Certified Nurse Midwife

## 2014-10-04 ENCOUNTER — Ambulatory Visit (INDEPENDENT_AMBULATORY_CARE_PROVIDER_SITE_OTHER): Payer: BC Managed Care – PPO | Admitting: Certified Nurse Midwife

## 2014-10-04 VITALS — BP 110/64 | HR 68 | Resp 16 | Ht 60.75 in | Wt 185.0 lb

## 2014-10-04 DIAGNOSIS — Z01419 Encounter for gynecological examination (general) (routine) without abnormal findings: Secondary | ICD-10-CM | POA: Diagnosis not present

## 2014-10-04 DIAGNOSIS — E039 Hypothyroidism, unspecified: Secondary | ICD-10-CM | POA: Diagnosis not present

## 2014-10-04 DIAGNOSIS — Z0000 Encounter for general adult medical examination without abnormal findings: Secondary | ICD-10-CM

## 2014-10-04 DIAGNOSIS — Z124 Encounter for screening for malignant neoplasm of cervix: Secondary | ICD-10-CM | POA: Diagnosis not present

## 2014-10-04 LAB — POCT URINALYSIS DIPSTICK
Bilirubin, UA: NEGATIVE
Blood, UA: NEGATIVE
Glucose, UA: NEGATIVE
Ketones, UA: NEGATIVE
LEUKOCYTES UA: NEGATIVE
Nitrite, UA: NEGATIVE
PROTEIN UA: NEGATIVE
UROBILINOGEN UA: NEGATIVE
pH, UA: 5

## 2014-10-04 LAB — COMPREHENSIVE METABOLIC PANEL
ALK PHOS: 102 U/L (ref 39–117)
ALT: 19 U/L (ref 0–35)
AST: 17 U/L (ref 0–37)
Albumin: 4.1 g/dL (ref 3.5–5.2)
BUN: 10 mg/dL (ref 6–23)
CO2: 28 mEq/L (ref 19–32)
Calcium: 9 mg/dL (ref 8.4–10.5)
Chloride: 103 mEq/L (ref 96–112)
Creat: 0.95 mg/dL (ref 0.50–1.10)
Glucose, Bld: 70 mg/dL (ref 70–99)
POTASSIUM: 4.5 meq/L (ref 3.5–5.3)
Sodium: 140 mEq/L (ref 135–145)
TOTAL PROTEIN: 7 g/dL (ref 6.0–8.3)
Total Bilirubin: 0.7 mg/dL (ref 0.2–1.2)

## 2014-10-04 LAB — TSH: TSH: 1.795 u[IU]/mL (ref 0.350–4.500)

## 2014-10-04 NOTE — Patient Instructions (Signed)

## 2014-10-04 NOTE — Progress Notes (Signed)
44 y.o. [id number] Married  Caucasian Fe here for annual exam. Periods normal with Paragard IUD. No problems with periods. Thyroid medication working well. Denies vaginal itching or change in vaginal discharge. Sees Urgent care if needed. Patient trying to work on weight and exercise now. Screening labs desired if needed. No other health issues today.  Patient's last menstrual period was 09/16/2014.          Sexually active: Yes.    The current method of family planning is IUD.    Exercising: No.  exercise Smoker:  no  Health Maintenance: Pap:  09-25-13 ASCUS HPV HR neg MMG: 10-04-14 done today Colonoscopy: none BMD:   none TDaP:  ? Date  Labs: Poct urine-neg Self breast exam: done occ   reports that she has never smoked. She does not have any smokeless tobacco history on file. She reports that she does not drink alcohol or use illicit drugs.  Past Medical History  Diagnosis Date  . Thyroid disease     hypothyroidism    Past Surgical History  Procedure Laterality Date  . Intrauterine device insertion      inserted 02/02/11, removal 02/01/21    Current Outpatient Prescriptions  Medication Sig Dispense Refill  . Fexofenadine HCl (ALLEGRA PO) Take by mouth daily.    Marland Kitchen. levothyroxine (SYNTHROID, LEVOTHROID) 75 MCG tablet TAKE 1 TABLET EVERY DAY 30 tablet 12  . PARAGARD INTRAUTERINE COPPER IU by Intrauterine route.    . Pseudoephedrine HCl (SUDAFED PO) Take by mouth as needed.     No current facility-administered medications for this visit.    Family History  Problem Relation Age of Onset  . Breast cancer Mother   . Heart disease Mother   . Heart disease Father   . Breast cancer Maternal Aunt   . Heart disease Maternal Grandmother     ROS:  Pertinent items are noted in HPI.  Otherwise, a comprehensive ROS was negative.  Exam:   BP 110/64 mmHg  Pulse 68  Resp 16  Ht 5' 0.75" (1.543 m)  Wt 185 lb (83.915 kg)  BMI 35.25 kg/m2  LMP 09/16/2014 Height: 5' 0.75" (154.3 cm) Ht  Readings from Last 3 Encounters:  10/04/14 5' 0.75" (1.543 m)  09/25/13 5\' 1"  (1.549 m)  09/12/12 5' 1.25" (1.556 m)    General appearance: alert, cooperative and appears stated age Head: Normocephalic, without obvious abnormality, atraumatic Neck: no adenopathy, supple, symmetrical, trachea midline and thyroid normal to inspection and palpation and small nodule noted on right no size change Lungs: clear to auscultation bilaterally Breasts: normal appearance, no masses or tenderness, No nipple retraction or dimpling, No nipple discharge or bleeding, No axillary or supraclavicular adenopathy Heart: regular rate and rhythm Abdomen: soft, non-tender; no masses,  no organomegaly Extremities: extremities normal, atraumatic, no cyanosis or edema Skin: Skin color, texture, turgor normal. No rashes or lesions Lymph nodes: Cervical, supraclavicular, and axillary nodes normal. No abnormal inguinal nodes palpated Neurologic: Grossly normal   Pelvic: External genitalia:  no lesions              Urethra:  normal appearing urethra with no masses, tenderness or lesions              Bartholin's and Skene's: normal                 Vagina: normal appearing vagina with normal color and discharge, no lesions              Cervix: normal,non tender,  IUD string noted               Pap taken: Yes.   Bimanual Exam:  Uterus:  normal size, contour, position, consistency, mobility, non-tender              Adnexa: normal adnexa and no mass, fullness, tenderness               Rectovaginal: Confirms               Anus:  normal sphincter tone, no lesions  Chaperone present: Yes  A:  Well Woman with normal exam  Contraception Paragard IUD  ASCUS pap follow up today  Hypothyroid on stable medication, TSH today  Screening labs  P:   Reviewed health and wellness pertinent to exam  Aware of warning signs with IUD and need to advise  If negative will need routine screening next year, if not per results  Patient  has enough Rx until results in  Lab TSH, CMP  Pap smear with HPVHR   counseled on breast self exam, mammography screening, adequate intake of calcium and vitamin D, diet and exercise  return annually or prn  An After Visit Summary was printed and given to the patient.

## 2014-10-04 NOTE — Progress Notes (Signed)
Reviewed personally.  M. Suzanne Regina Coppolino, MD.  

## 2014-10-08 ENCOUNTER — Other Ambulatory Visit: Payer: Self-pay | Admitting: Certified Nurse Midwife

## 2014-10-08 DIAGNOSIS — E039 Hypothyroidism, unspecified: Secondary | ICD-10-CM

## 2014-10-08 MED ORDER — LEVOTHYROXINE SODIUM 75 MCG PO TABS: ORAL_TABLET | ORAL | Status: DC

## 2014-10-09 LAB — IPS PAP TEST WITH HPV

## 2014-10-10 ENCOUNTER — Other Ambulatory Visit: Payer: Self-pay | Admitting: Certified Nurse Midwife

## 2014-10-10 DIAGNOSIS — R87610 Atypical squamous cells of undetermined significance on cytologic smear of cervix (ASC-US): Secondary | ICD-10-CM

## 2014-10-13 ENCOUNTER — Other Ambulatory Visit: Payer: Self-pay | Admitting: Certified Nurse Midwife

## 2014-10-14 ENCOUNTER — Telehealth: Payer: Self-pay | Admitting: Emergency Medicine

## 2014-10-14 NOTE — Telephone Encounter (Signed)
Patient notified of message from Verner Choleborah S. Leonard CNM.  She is agreeable to scheduling colposcopy.  Brief description of procedure given to patient.  Colposcopy pre-procedure instructions given.Discussed menses and need to not have any bleeding on day of appointment, advised to call to reschedule if starts cycle.  Make sure to eat a meal and hydrate before appointment.  Advised 800 mg of Motrin with food one hour prior to appointment. Motrin/Advil or Ibuprofen. Take 800 mg (Can purchase over the counter, you will need four 200 mg pills).    Patient verbalized understanding of preprocedure instructions and cancellation policy and will call to reschedule if will be on menses or has any concerns regarding pregnancy. Confirmed patient has Paragard IUD and LMP patient unsure but states she has had a cycle this month.  Patient is advised she will be contacted with insurance coverage information. Patient agreeable. Patient scheduled for 10/18/14 at 1400           Cc Lilyan GilfordBecky Frahm for insurance pre certification.  Routing to provider for final review. Patient agreeable to disposition. Will close encounter.

## 2014-10-14 NOTE — Telephone Encounter (Signed)
Message left to return call to Sherrelwoodracy at [phone].   Patient with Paragard.   Order has been placed by Verner Choleborah S. Leonard CNM.

## 2014-10-14 NOTE — Telephone Encounter (Addendum)
-----   Message from Verner Choleborah S Leonard, CNM sent at 10/10/2014  3:11 PM EDT ----- Notify patient that pap smear is still abnormal with ASCUS even though HPVHR is negative. She needs colposcopy exam. Please schedule. Make sure she is aware she will be contacted by our insurance personnel with info. \ Patient to be notified of colposcopy need and will be given info that insurance infor will be called to her OK to schedule with DL

## 2014-10-14 NOTE — Telephone Encounter (Signed)
Pt's Synthroid 75 mcg medication was refilled on 10/08/14 # 30 with 12 refills to [id number] - [place], [place] - [address].  Pt's medication denied.

## 2014-10-15 ENCOUNTER — Telehealth: Payer: Self-pay | Admitting: Certified Nurse Midwife

## 2014-10-15 NOTE — Telephone Encounter (Signed)
Called patient to review benefits for procedure. Left voicemail to call back and review. °

## 2014-10-15 NOTE — Telephone Encounter (Signed)
Spoke with patient regarding benefit for colposcopy scheduled on 10/17/14. Patient understands and agreeable. Confirmed appointment details. Ok to close.

## 2014-10-17 ENCOUNTER — Encounter: Payer: Self-pay | Admitting: Certified Nurse Midwife

## 2014-10-17 ENCOUNTER — Ambulatory Visit (INDEPENDENT_AMBULATORY_CARE_PROVIDER_SITE_OTHER): Payer: BC Managed Care – PPO | Admitting: Certified Nurse Midwife

## 2014-10-17 DIAGNOSIS — R87610 Atypical squamous cells of undetermined significance on cytologic smear of cervix (ASC-US): Secondary | ICD-10-CM

## 2014-10-17 NOTE — Progress Notes (Signed)
Reviewed personally.  M. Suzanne Karyme Mcconathy, MD.  

## 2014-10-17 NOTE — Progress Notes (Signed)
10-04-14 ASCUS HPV HR neg 09-25-13 ASCUS HPV HR neg Pt took  ibuprofen at 12:45

## 2014-10-17 NOTE — Patient Instructions (Signed)

## 2014-10-17 NOTE — Progress Notes (Signed)
Patient ID: Leah Estrada, female   DOB: 1970-07-18, 44 y.o.   MRN: [id number]  Chief Complaint  Patient presents with  . Colposcopy    HPI Leah Estrada is a 44 y.o.  White [id number] married female here for colposcopy exam. Denies vaginal bleeding or pain.  HPI  Indications: Pap smear on 7/8 2016 showed: ASCUS. Previous colposcopy:none, previous abnormal pap smear ASCUS negative HPVHR  Past Medical History  Diagnosis Date  . Thyroid disease     hypothyroidism  . Abnormal Pap smear of cervix     2015, 2016 ASCUS HPV HR neg    Past Surgical History  Procedure Laterality Date  . Intrauterine device insertion      inserted 02/02/11, removal 02/01/21    Family History  Problem Relation Age of Onset  . Breast cancer Mother   . Heart disease Mother   . Heart disease Father   . Breast cancer Maternal Aunt   . Heart disease Maternal Grandmother     Social History History  Substance Use Topics  . Smoking status: Never Smoker   . Smokeless tobacco: Not on file  . Alcohol Use: No    Allergies  Allergen Reactions  . Flagyl [Metronidazole] Hives    Current Outpatient Prescriptions  Medication Sig Dispense Refill  . Fexofenadine HCl (ALLEGRA PO) Take by mouth daily.    Marland Kitchen levothyroxine (SYNTHROID, LEVOTHROID) 75 MCG tablet TAKE 1 TABLET EVERY DAY 30 tablet 12  . PARAGARD INTRAUTERINE COPPER IU by Intrauterine route.    . Pseudoephedrine HCl (SUDAFED PO) Take by mouth as needed.     No current facility-administered medications for this visit.    Review of Systems Review of Systems  Constitutional: Negative.   Genitourinary: Negative for vaginal bleeding, vaginal discharge and vaginal pain.    Blood pressure 110/70, pulse 72, resp. rate 20, height 5' 0.75" (1.543 m), weight 186 lb (84.369 kg), last menstrual period 10/05/2014.  Physical Exam Physical Exam  Constitutional: She is oriented to person, place, and time. She appears well-developed and well-nourished.    Genitourinary: Vagina normal. No vaginal discharge found.    Neurological: She is alert and oriented to person, place, and time.  Skin: Skin is warm and dry.  Psychiatric: She has a normal mood and affect. Her behavior is normal. Judgment and thought content normal.    Data Reviewed Reviewed pap smear results, questions answered.  Assessment    ASCUS pap smear x 2 Procedure Details  The risks and benefits of the procedure and Written informed consent obtained.  Speculum placed in vagina and excellent visualization of cervix achieved, cervix swabbed x 3 with acetic acid solution with tiny area of acetowhite noted at 5 o'clock at cervical edge, Lugol's applied with non staining noted in same area. Biopsy taken at 5 o'clock. ECC attempted with Paragard IUD in place and collected. Monsel's applied. No active bleeding noted on removal of speculum. Patient tolerated procedure well.  Specimens: 2  Complications: none.     Plan    Specimens labelled and sent to Pathology. Patient will be notified when pathology reviewed.  Leah Estrada 10/17/2014, 2:40 PM

## 2014-10-21 LAB — IPS OTHER TISSUE BIOPSY

## 2014-10-24 ENCOUNTER — Telehealth: Payer: Self-pay | Admitting: Emergency Medicine

## 2014-10-24 NOTE — Telephone Encounter (Signed)
Patient returned Tracy's call gave her Ms. Debbies results as stated below. Patient is agreeable, she asked if we could move her appointment up sooner. I scheduled her for AEX 10/07/14 with Ms. Debbie. Patient is also aware to call us two weeks prior to her appointment in regards to treatment of BV.  08 recall entered for 10/26/15.  Routed to provider for review, encounter closed.

## 2014-10-24 NOTE — Telephone Encounter (Signed)
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 Suspect from history of chronic BV Would like to treat patient for BV 2 weeks prior to next pap will need to call us prior to pap for RX Repeat pap in one year 08 recall

## 2014-10-24 NOTE — Telephone Encounter (Signed)
Message left to return call to Jadriel Saxer at [phone].    

## 2015-05-15 ENCOUNTER — Ambulatory Visit (INDEPENDENT_AMBULATORY_CARE_PROVIDER_SITE_OTHER): Payer: BC Managed Care – PPO | Admitting: Family Medicine

## 2015-05-15 ENCOUNTER — Ambulatory Visit: Payer: Self-pay | Admitting: Family Medicine

## 2015-05-15 ENCOUNTER — Encounter: Payer: Self-pay | Admitting: Family Medicine

## 2015-05-15 VITALS — BP 122/76 | HR 64 | Temp 98.5°F | Wt 178.2 lb

## 2015-05-15 DIAGNOSIS — J209 Acute bronchitis, unspecified: Secondary | ICD-10-CM | POA: Diagnosis not present

## 2015-05-15 MED ORDER — BENZONATATE 200 MG PO CAPS: 200.0000 mg | ORAL_CAPSULE | Freq: Two times a day (BID) | ORAL | Status: DC | PRN

## 2015-05-15 MED ORDER — ALBUTEROL SULFATE HFA 108 (90 BASE) MCG/ACT IN AERS: 2.0000 | INHALATION_SPRAY | Freq: Four times a day (QID) | RESPIRATORY_TRACT | Status: DC | PRN

## 2015-05-15 MED ORDER — AZITHROMYCIN 250 MG PO TABS: ORAL_TABLET | ORAL | Status: DC

## 2015-05-15 NOTE — Progress Notes (Signed)
Subjective:  Leah Estrada is Leah Estrada female who presents for 3 day history of cough, sinus pressure, congestion, right ear pain. Denies fever, chills, body aches, sore throat, nausea, vomiting. She is new to the practice and here to establish primary care as well. Has not had a PCP in a while.   Treatment to date: decongestants.  Positive sick contacts- daughter with fever.  No other aggravating or relieving factors.  No other c/o. Has underlying allergies, seasonal.   ROS as in subjective.  Other providers: Sara Chu, [facility] [employer].  History of hypothyroid.  Family history of heart disease in parents, hyperlipidemia, HTN, breast cancer in mom. Father with cardiac stents.    Objective: Filed Vitals:   05/15/15 1422  BP: 122/76  Pulse: 64  Temp: 98.5 F (36.9 C)    General appearance: Alert, WD/WN, no distress, mildly ill appearing                             Skin: warm, no rash                           Head: no sinus tenderness                            Eyes: conjunctiva normal, corneas clear, PERRLA                            Ears: pearly TMs, external ear canals normal                          Nose: septum midline, turbinates swollen, with erythema and clear discharge             Mouth/throat: MMM, tongue normal, mild pharyngeal erythema                           Neck: supple, no adenopathy, no thyromegaly, nontender                          Heart: RRR, normal S1, S2, no murmurs                         Lungs: CTA bilaterally, no wheezes, rales, or rhonchi     Assessment: Acute bronchitis, unspecified organism - Plan: azithromycin (ZITHROMAX Z-PAK) 250 MG tablet, albuterol (PROVENTIL HFA;VENTOLIN HFA) 108 (90 Base) MCG/ACT inhaler, benzonatate (TESSALON) 200 MG capsule  Plan: Discussed diagnosis and treatment of URI.  Suggested symptomatic OTC remedies. Tessalon prescribed. Albuterol inhaler prescribed with instructions for use. Discussed that this is  temporary and she should not need this long term.   Nasal saline spray for congestion.  Tylenol or Ibuprofen OTC for fever and malaise.  Discussed that I recommend she give her illness 2-3 more days and then start the Z pak if not improving or start the antibiotic if symptoms worsen in next 1-2 days. She will let me know if not back to normal after completing the antibiotic if she decides to take it. Discussed red flags that might indicate pneumonia or worsening lung issue.

## 2015-05-15 NOTE — Patient Instructions (Addendum)
Mucinex DM or Robitussin-DM for cough I am also prescribing Tessalon which is a nonsedating cough medicine  and I am sending in a prescription for azithromycin, you can give this a couple more days and just treat your symptoms if you want.  At this point in your illness it is difficult to determine if you have a virus or bacterial infection. Antibiotics do not work for viruses.  Try the albuterol inhaler for the next 2- 3 days.  Let us know if you develop a high fever, worsening productive cough, or chest pain.  Make sure you are staying well hydrated.  Acute Bronchitis Bronchitis is inflammation of the airways that extend from the windpipe into the lungs (bronchi). The inflammation often causes mucus to develop. This leads to a cough, which is the most common symptom of bronchitis.  In acute bronchitis, the condition usually develops suddenly and goes away over time, usually in a couple weeks. Smoking, allergies, and asthma can make bronchitis worse. Repeated episodes of bronchitis may cause further lung problems.  CAUSES Acute bronchitis is most often caused by the same virus that causes a cold. The virus can spread from person to person (contagious) through coughing, sneezing, and touching contaminated objects. SIGNS AND SYMPTOMS   Cough.   Fever.   Coughing up mucus.   Body aches.   Chest congestion.   Chills.   Shortness of breath.   Sore throat.  DIAGNOSIS  Acute bronchitis is usually diagnosed through a physical exam. Your health care provider will also ask you questions about your medical history. Tests, such as chest X-rays, are sometimes done to rule out other conditions.  TREATMENT  Acute bronchitis usually goes away in a couple weeks. Oftentimes, no medical treatment is necessary. Medicines are sometimes given for relief of fever or cough. Antibiotic medicines are usually not needed but may be prescribed in certain situations. In some cases, an inhaler may be  recommended to help reduce shortness of breath and control the cough. A cool mist vaporizer may also be used to help thin bronchial secretions and make it easier to clear the chest.  HOME CARE INSTRUCTIONS  Get plenty of rest.   Drink enough fluids to keep your urine clear or pale yellow (unless you have a medical condition that requires fluid restriction). Increasing fluids may help thin your respiratory secretions (sputum) and reduce chest congestion, and it will prevent dehydration.   Take medicines only as directed by your health care provider.  If you were prescribed an antibiotic medicine, finish it all even if you start to feel better.  Avoid smoking and secondhand smoke. Exposure to cigarette smoke or irritating chemicals will make bronchitis worse. If you are a smoker, consider using nicotine gum or skin patches to help control withdrawal symptoms. Quitting smoking will help your lungs heal faster.   Reduce the chances of another bout of acute bronchitis by washing your hands frequently, avoiding people with cold symptoms, and trying not to touch your hands to your mouth, nose, or eyes.   Keep all follow-up visits as directed by your health care provider.  SEEK MEDICAL CARE IF: Your symptoms do not improve after 1 week of treatment.  SEEK IMMEDIATE MEDICAL CARE IF:  You develop an increased fever or chills.   You have chest pain.   You have severe shortness of breath.  You have bloody sputum.   You develop dehydration.  You faint or repeatedly feel like you are going to pass out.  You  develop repeated vomiting.  You develop a severe headache. MAKE SURE YOU:   Understand these instructions.  Will watch your condition.  Will get help right away if you are not doing well or get worse.   This information is not intended to replace advice given to you by your health care provider. Make sure you discuss any questions you have with your health care provider.     Document Released: 04/22/2004 Document Revised: 04/05/2014 Document Reviewed: 09/05/2012 Elsevier Interactive Patient Education [employer]

## 2015-05-16 ENCOUNTER — Telehealth: Payer: Self-pay | Admitting: Internal Medicine

## 2015-05-16 NOTE — Telephone Encounter (Signed)
Left message for pt to call me back 

## 2015-05-16 NOTE — Telephone Encounter (Signed)
This doesn't change anything as far as her illness.  As we discussed yesterday, her symptoms may be related to a virus and the flu is a virus. If she is not improving in the next few days she can start the antibiotic.

## 2015-05-16 NOTE — Telephone Encounter (Signed)
Pt was notified about the illness and what to do

## 2015-05-16 NOTE — Telephone Encounter (Signed)
Pt came in yesterday and was given anitbiotic with URI and then later on that day she took her daughter to doctor and she has the flu. Will that change anything with patient. She does not seem any better today but had not gotten the zpak yet. Does she need to come back in to be tested for flu or start the meds.

## 2015-05-16 NOTE — Telephone Encounter (Signed)
Pt was notified of results

## 2015-09-04 ENCOUNTER — Telehealth: Payer: Self-pay | Admitting: Certified Nurse Midwife

## 2015-09-04 NOTE — Telephone Encounter (Signed)
Left message to call Joshwa Hemric at [phone]. 

## 2015-09-04 NOTE — Telephone Encounter (Signed)
Encounter closed in error.

## 2015-09-04 NOTE — Telephone Encounter (Signed)
Patient is asking if she needs to take medication 2 weeks before her aex appointment 10/07/15 .?

## 2015-09-05 ENCOUNTER — Telehealth: Payer: Self-pay | Admitting: Certified Nurse Midwife

## 2015-09-05 NOTE — Telephone Encounter (Signed)
Please see telephone encounter dated 09/04/2015. Will close encounter.

## 2015-09-05 NOTE — Telephone Encounter (Signed)
Yes she needs to do this. Would like her to do Metrogel

## 2015-09-05 NOTE — Telephone Encounter (Signed)
Spoke with patient. Patient states she is calling because she was advised to contact the office for a prescription to take 2 weeks before her aex this year. States she was advised of this after her colposcopy last year. Please see result note below. Advised patient I will speak with Leota Sauerseborah Leonard CNM about recommended medication and return call with instructions on how to take this before her next pap on 10/07/2015. She is agreeable.  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 Suspect from history of chronic BV Would like to treat patient for BV 2 weeks prior to next pap will need to call us prior to pap for RX Repeat pap in one year 08 recall

## 2015-09-05 NOTE — Telephone Encounter (Signed)
Patient states she is returning a call to Leah Estrada.

## 2015-09-08 ENCOUNTER — Telehealth: Payer: Self-pay | Admitting: *Deleted

## 2015-09-08 NOTE — Telephone Encounter (Signed)
Annual exam scheduled for 10-07-15. Call back to patient to advise of Debbi's instruction for Metrogel. See next encounter.

## 2015-09-08 NOTE — Telephone Encounter (Signed)
See previous encounter. Call to patient, left message to call back. Calling to provide instruction from Debbi regarding Metrogel prior to annual exam scheduled for 10-07-15.    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 Suspect from history of chronic BV Would like to treat patient for BV 2 weeks prior to next pap will need to call us prior to pap for RX Repeat pap in one year 08 recall

## 2015-09-22 NOTE — Telephone Encounter (Signed)
Patient is calling regarding the medication she is ti take before coming to her aex appointment.

## 2015-09-22 NOTE — Telephone Encounter (Signed)
Patty, Can you review and advise for medication order. Patient allergic to Flagyl.

## 2015-09-23 ENCOUNTER — Telehealth: Payer: Self-pay | Admitting: Certified Nurse Midwife

## 2015-09-23 ENCOUNTER — Other Ambulatory Visit: Payer: Self-pay | Admitting: Certified Nurse Midwife

## 2015-09-23 DIAGNOSIS — B9689 Other specified bacterial agents as the cause of diseases classified elsewhere: Secondary | ICD-10-CM

## 2015-09-23 DIAGNOSIS — N760 Acute vaginitis: Principal | ICD-10-CM

## 2015-09-23 MED ORDER — CLINDAMYCIN PHOSPHATE 2 % VA CREA: 1.0000 | TOPICAL_CREAM | Freq: Every day | VAGINAL | Status: DC

## 2015-09-23 NOTE — Telephone Encounter (Signed)
Noted message from Deborah Leonard CNM.  Will close encounter.   

## 2015-09-23 NOTE — Telephone Encounter (Signed)
LM regarding prescription sent to pharmacy for her to pick up and use, regarding her phone call question. Advised to call back if any questions.

## 2015-09-23 NOTE — Telephone Encounter (Signed)
Called in Rx cleocin and called patient to let her know sent to pharmacy.

## 2015-10-07 ENCOUNTER — Encounter: Payer: Self-pay | Admitting: Certified Nurse Midwife

## 2015-10-07 ENCOUNTER — Ambulatory Visit (INDEPENDENT_AMBULATORY_CARE_PROVIDER_SITE_OTHER): Payer: BC Managed Care – PPO | Admitting: Certified Nurse Midwife

## 2015-10-07 VITALS — BP 120/72 | HR 74 | Resp 16 | Ht 61.0 in | Wt 183.0 lb

## 2015-10-07 DIAGNOSIS — Z23 Encounter for immunization: Secondary | ICD-10-CM

## 2015-10-07 DIAGNOSIS — E039 Hypothyroidism, unspecified: Secondary | ICD-10-CM | POA: Diagnosis not present

## 2015-10-07 DIAGNOSIS — Z01419 Encounter for gynecological examination (general) (routine) without abnormal findings: Secondary | ICD-10-CM

## 2015-10-07 DIAGNOSIS — Z124 Encounter for screening for malignant neoplasm of cervix: Secondary | ICD-10-CM

## 2015-10-07 DIAGNOSIS — Z0000 Encounter for general adult medical examination without abnormal findings: Secondary | ICD-10-CM

## 2015-10-07 LAB — POCT URINALYSIS DIPSTICK
Bilirubin, UA: NEGATIVE
Glucose, UA: NEGATIVE
KETONES UA: NEGATIVE
LEUKOCYTES UA: NEGATIVE
Nitrite, UA: NEGATIVE
PH UA: 5
PROTEIN UA: NEGATIVE
UROBILINOGEN UA: NEGATIVE

## 2015-10-07 LAB — HEMOGLOBIN, FINGERSTICK: HEMOGLOBIN, FINGERSTICK: 13 g/dL (ref 12.0–16.0)

## 2015-10-07 NOTE — Patient Instructions (Signed)

## 2015-10-07 NOTE — Progress Notes (Signed)
45 y.o. 391P1001 Married  Caucasian Fe here for annual exam.   Paragard IUD working well. Periods normal regular, no changes. Sees Urgent care if needed. Treated for flu this past year. Used Cleocin cream prior visit today to hopefully avoid BV again on pap. Patient completed all medication. On light menses today. No health issues today. Daughter starting drivers ED!   Patient's last menstrual period was 10/06/2015.          Sexually active: Yes.    The current method of family planning is IUD.    Exercising: No.  exercise Smoker:  no  Health Maintenance: Pap: 10-04-14 ASCUS HPV HR -, colpo 10-17-14 MMG: 10-04-14 category b density, birads 1:neg,scheduled 10/10/15 Colonoscopy:  none BMD:   none TDaP:  due Shingles: no Pneumonia: no Hep C and HIV: HIV done with pregnancy Labs: poct urine-tr rbc(menses), hgb-13.0 Self breast exam: done monthly   reports that she has never smoked. She does not have any smokeless tobacco history on file. She reports that she does not drink alcohol or use illicit drugs.  Past Medical History  Diagnosis Date  . Thyroid disease     hypothyroidism  . Abnormal Pap smear of cervix     2015, 2016 ASCUS HPV HR neg    Past Surgical History  Procedure Laterality Date  . Intrauterine device insertion      inserted 02/02/11, removal 02/01/21    Current Outpatient Prescriptions  Medication Sig Dispense Refill  . Fexofenadine HCl (ALLEGRA PO) Take by mouth daily.    Marland Kitchen. levothyroxine (SYNTHROID, LEVOTHROID) 75 MCG tablet TAKE 1 TABLET EVERY DAY 30 tablet 12  . PARAGARD INTRAUTERINE COPPER IU by Intrauterine route.    . Pseudoephedrine HCl (SUDAFED PO) Take by mouth as needed. Reported on 10/07/2015     No current facility-administered medications for this visit.    Family History  Problem Relation Age of Onset  . Breast cancer Mother   . Heart disease Mother   . Hyperlipidemia Mother   . Heart disease Father   . Breast cancer Maternal Aunt   . Heart disease  Maternal Grandmother     ROS:  Pertinent items are noted in HPI.  Otherwise, a comprehensive ROS was negative.  Exam:   BP 120/72 mmHg  Pulse 74  Resp 16  Ht 5\' 1"  (1.549 m)  Wt 183 lb (83.008 kg)  BMI 34.60 kg/m2  LMP 10/06/2015 Height: 5\' 1"  (154.9 cm) Ht Readings from Last 3 Encounters:  10/07/15 5\' 1"  (1.549 m)  10/17/14 5' 0.75" (1.543 m)  10/04/14 5' 0.75" (1.543 m)    General appearance: alert, cooperative and appears stated age Head: Normocephalic, without obvious abnormality, atraumatic Neck: no adenopathy, supple, symmetrical, trachea midline and thyroid normal to inspection and palpation Lungs: clear to auscultation bilaterally Breasts: normal appearance, no masses or tenderness, No nipple retraction or dimpling, No nipple discharge or bleeding, No axillary or supraclavicular adenopathy Heart: regular rate and rhythm Abdomen: soft, non-tender; no masses,  no organomegaly Extremities: extremities normal, atraumatic, no cyanosis or edema Skin: Skin color, texture, turgor normal. No rashes or lesions Lymph nodes: Cervical, supraclavicular, and axillary nodes normal. No abnormal inguinal nodes palpated Neurologic: Grossly normal   Pelvic: External genitalia:  no lesions              Urethra:  normal appearing urethra with no masses, tenderness or lesions              Bartholin's and Skene's: normal  Vagina: normal appearing vagina with normal color and discharge, no lesions              Cervix: multiparous appearance, no cervical motion tenderness, no lesions and IUD string noted              Pap taken: Yes.   Bimanual Exam:  Uterus:  normal size, contour, position, consistency, mobility, non-tender and anteverted              Adnexa: normal adnexa and no mass, fullness, tenderness               Rectovaginal: Confirms               Anus:  normal sphincter tone, no lesions  Chaperone present: yes  A:  Well Woman with normal exam  Contraception  Paraguard due for removal 2022  Hypothyroid stable on medication  History of chronic BV, Cleocin use prior to pap  Ascus pap with negative HPVHR follow up pap today  Family history of breast cancer, mother age 29  Immunization due  Screening labs  P:   Reviewed health and wellness pertinent to exam  Reviewed warning signs with IUD and need to advise  Lab: TSH will renew RX when in , has RX   Will advise of pap results and any BV present  Stressed importance of SBE and mammogram.  Requests TDAP  Lab: Vitamin D, Hep C,Lipid panel  Pap smear as above with HPV reflex   counseled on breast self exam, mammography screening, adequate intake of calcium and vitamin D, diet and exercise  return annually or prn  An After Visit Summary was printed and given to the patient.

## 2015-10-08 LAB — HEPATITIS C ANTIBODY: HCV Ab: NEGATIVE

## 2015-10-08 LAB — LIPID PANEL
CHOL/HDL RATIO: 3.6 ratio (ref <=5.0)
CHOLESTEROL: 130 mg/dL (ref 125–200)
HDL: 36 mg/dL — AB (ref >=46)
LDL CALC: 82 mg/dL (ref <130)
TRIGLYCERIDES: 60 mg/dL (ref <150)
VLDL: 12 mg/dL (ref <30)

## 2015-10-08 LAB — TSH: TSH: 3.45 mIU/L

## 2015-10-08 LAB — VITAMIN D 25 HYDROXY (VIT D DEFICIENCY, FRACTURES): Vit D, 25-Hydroxy: 35 ng/mL (ref 30–100)

## 2015-10-09 ENCOUNTER — Telehealth: Payer: Self-pay

## 2015-10-09 ENCOUNTER — Other Ambulatory Visit: Payer: Self-pay | Admitting: Certified Nurse Midwife

## 2015-10-09 DIAGNOSIS — E039 Hypothyroidism, unspecified: Secondary | ICD-10-CM

## 2015-10-09 LAB — IPS PAP TEST WITH HPV

## 2015-10-09 MED ORDER — LEVOTHYROXINE SODIUM 75 MCG PO TABS: ORAL_TABLET | ORAL | Status: DC

## 2015-10-09 NOTE — Telephone Encounter (Signed)
Patient notified of results. See lab 

## 2015-10-09 NOTE — Telephone Encounter (Signed)
lmtcb

## 2015-10-09 NOTE — Telephone Encounter (Signed)
-----   Message from Verner Choleborah S Leonard, CNM sent at 10/09/2015  2:46 AM EDT ----- Notify Vitamin D is borderline low. Needs to start on OTC 800 iu daily Vitamin D 3 Hep C is negative TSH is normal, but elevated for her on Synthroid Would like to recheck in 6 weeks, if continues elevation will need to change Synthroid dose Rx renewed for one month, order place for TSH with panel, please schedule Lipid panel is normal except for low HDL. Increase lean protein in diet and start regular exercise!

## 2015-10-13 NOTE — Progress Notes (Signed)
Encounter reviewed Leah Jenning, MD   

## 2015-10-14 ENCOUNTER — Ambulatory Visit: Payer: BC Managed Care – PPO | Admitting: Certified Nurse Midwife

## 2015-10-15 ENCOUNTER — Encounter: Payer: Self-pay | Admitting: Certified Nurse Midwife

## 2015-10-20 ENCOUNTER — Other Ambulatory Visit: Payer: Self-pay | Admitting: Certified Nurse Midwife

## 2015-10-20 DIAGNOSIS — E039 Hypothyroidism, unspecified: Secondary | ICD-10-CM

## 2015-11-18 ENCOUNTER — Other Ambulatory Visit: Payer: Self-pay

## 2015-11-20 ENCOUNTER — Other Ambulatory Visit (INDEPENDENT_AMBULATORY_CARE_PROVIDER_SITE_OTHER): Payer: BC Managed Care – PPO

## 2015-11-20 DIAGNOSIS — E039 Hypothyroidism, unspecified: Secondary | ICD-10-CM

## 2015-11-21 ENCOUNTER — Other Ambulatory Visit: Payer: Self-pay | Admitting: Certified Nurse Midwife

## 2015-11-21 DIAGNOSIS — E039 Hypothyroidism, unspecified: Secondary | ICD-10-CM

## 2015-11-21 LAB — THYROID PANEL WITH TSH
Free Thyroxine Index: 2.6 (ref 1.4–3.8)
T3 UPTAKE: 35 % (ref 22–35)
T4 TOTAL: 7.3 ug/dL (ref 4.5–12.0)
TSH: 1.71 m[IU]/L

## 2015-11-21 MED ORDER — LEVOTHYROXINE SODIUM 75 MCG PO TABS: ORAL_TABLET | ORAL | 11 refills | Status: DC

## 2015-12-17 ENCOUNTER — Other Ambulatory Visit: Payer: Self-pay | Admitting: Certified Nurse Midwife

## 2015-12-17 DIAGNOSIS — E039 Hypothyroidism, unspecified: Secondary | ICD-10-CM

## 2016-10-07 ENCOUNTER — Encounter: Payer: Self-pay | Admitting: Certified Nurse Midwife

## 2016-10-07 ENCOUNTER — Other Ambulatory Visit (HOSPITAL_COMMUNITY)
Admission: RE | Admit: 2016-10-07 | Discharge: 2016-10-07 | Disposition: A | Discharge status: Home / Self Care | Payer: BC Managed Care – PPO | Source: Ambulatory Visit | Attending: Obstetrics & Gynecology | Admitting: Obstetrics & Gynecology

## 2016-10-07 ENCOUNTER — Ambulatory Visit (INDEPENDENT_AMBULATORY_CARE_PROVIDER_SITE_OTHER): Payer: BC Managed Care – PPO | Admitting: Certified Nurse Midwife

## 2016-10-07 VITALS — BP 118/72 | HR 70 | Resp 16 | Ht 61.25 in | Wt 185.0 lb

## 2016-10-07 DIAGNOSIS — E039 Hypothyroidism, unspecified: Secondary | ICD-10-CM | POA: Diagnosis not present

## 2016-10-07 DIAGNOSIS — Z0000 Encounter for general adult medical examination without abnormal findings: Secondary | ICD-10-CM | POA: Diagnosis not present

## 2016-10-07 DIAGNOSIS — Z01419 Encounter for gynecological examination (general) (routine) without abnormal findings: Secondary | ICD-10-CM

## 2016-10-07 DIAGNOSIS — Z124 Encounter for screening for malignant neoplasm of cervix: Secondary | ICD-10-CM | POA: Diagnosis not present

## 2016-10-07 DIAGNOSIS — Z1211 Encounter for screening for malignant neoplasm of colon: Secondary | ICD-10-CM

## 2016-10-07 DIAGNOSIS — Z01411 Encounter for gynecological examination (general) (routine) with abnormal findings: Secondary | ICD-10-CM | POA: Diagnosis present

## 2016-10-07 NOTE — Progress Notes (Signed)
46 y.o. G61P1001 Married  Caucasian Fe here for annual exam.  Periods regular and not heavy with Paragard IUD use for contraception. Feels Synthroid is working well, no signs of change with thyroid. Sees Urgent care if needed. Screening labs if needed. No other health issues today. No weight gain! Took trip to [facility] ! Daughter 16 now!   Patient's last menstrual period was 09/19/2016 (exact date).          Sexually active: Yes.    The current method of family planning is IUD.    Exercising: No.  exercise Smoker:  no  Health Maintenance: Pap:  10-04-14 ASCUS HPV HR-, 10-07-15 neg HPV HR neg History of Abnormal Pap: yes MMG:  10-10-15 category b density birads 1:neg Self Breast exams: occ Colonoscopy:  none BMD:   none TDaP:  2017 Shingles: no Pneumonia: no Hep C and HIV: HIV done with pregnancy, hep c neg 2017 Labs: none   reports that she has never smoked. She has never used smokeless tobacco. She reports that she does not drink alcohol or use drugs.  Past Medical History:  Diagnosis Date  . Abnormal Pap smear of cervix    2015, 2016 ASCUS HPV HR neg  . Thyroid disease    hypothyroidism    Past Surgical History:  Procedure Laterality Date  . INTRAUTERINE DEVICE INSERTION     inserted 02/02/11, removal 02/01/21    Current Outpatient Prescriptions  Medication Sig Dispense Refill  . Fexofenadine HCl (ALLEGRA PO) Take by mouth daily.    Marland Kitchen levothyroxine (SYNTHROID, LEVOTHROID) 75 MCG tablet TAKE 1 TABLET EVERY DAY 30 tablet 11  . PARAGARD INTRAUTERINE COPPER IU by Intrauterine route.    . Pseudoephedrine HCl (SUDAFED PO) Take by mouth as needed. Reported on 10/07/2015     No current facility-administered medications for this visit.     Family History  Problem Relation Age of Onset  . Breast cancer Mother   . Heart disease Mother   . Hyperlipidemia Mother   . Heart disease Father   . Breast cancer Maternal Aunt   . Heart disease Maternal Grandmother     ROS:   Pertinent items are noted in HPI.  Otherwise, a comprehensive ROS was negative.  Exam:   BP 118/72   Pulse 70   Resp 16   Ht 5' 1.25" (1.556 m)   Wt 185 lb (83.9 kg)   LMP 09/19/2016 (Exact Date)   BMI 34.67 kg/m  Height: 5' 1.25" (155.6 cm) Ht Readings from Last 3 Encounters:  10/07/16 5' 1.25" (1.556 m)  10/07/15 5\' 1"  (1.549 m)  10/17/14 5' 0.75" (1.543 m)    General appearance: alert, cooperative and appears stated age Head: Normocephalic, without obvious abnormality, atraumatic Neck: no adenopathy, supple, symmetrical, trachea midline and thyroid normal to inspection and palpation Lungs: clear to auscultation bilaterally Breasts: normal appearance, no masses or tenderness, No nipple retraction or dimpling, No nipple discharge or bleeding, No axillary or supraclavicular adenopathy Heart: regular rate and rhythm Abdomen: soft, non-tender; no masses,  no organomegaly Extremities: extremities normal, atraumatic, no cyanosis or edema Skin: Skin color, texture, turgor normal. No rashes or lesions Lymph nodes: Cervical, supraclavicular, and axillary nodes normal. No abnormal inguinal nodes palpated Neurologic: Grossly normal   Pelvic: External genitalia:  no lesions              Urethra:  normal appearing urethra with no masses, tenderness or lesions  Bartholin's and Skene's: normal                 Vagina: normal appearing vagina with normal color and discharge, no lesions              Cervix: multiparous appearance, no bleeding following Pap, no cervical motion tenderness, no lesions and iud string noted in cervix              Pap taken: Yes.   Bimanual Exam:  Uterus:  normal size, contour, position, consistency, mobility, non-tender              Adnexa: normal adnexa and no mass, fullness, tenderness               Rectovaginal: Confirms               Anus:  normal sphincter tone, no lesions  Chaperone present: yes  A:  Well Woman with normal  exam  Contraception Paragard IUD due for removal 2022  Hypothyroid with stable medication  Screening lab  Colonoscopy due  P:   Reviewed health and wellness pertinent to exam  Reviewed warning signs of IUD and need to advise  Will renew Rx once lab in, has enough until refill  Labs: Lipid panel, TSH, Vitamin D  Discussed new colonoscopy guidelines, risks/benefits, patient not sure she is ready to do. Will check with insurance for coverage and advise. IFOB dispensed  Pap smear: yes   counseled on breast self exam, mammography screening, adequate intake of calcium and vitamin D, diet and exercise  return annually or prn  An After Visit Summary was printed and given to the patient.

## 2016-10-07 NOTE — Patient Instructions (Signed)

## 2016-10-08 ENCOUNTER — Other Ambulatory Visit: Payer: Self-pay | Admitting: Certified Nurse Midwife

## 2016-10-08 DIAGNOSIS — E039 Hypothyroidism, unspecified: Secondary | ICD-10-CM

## 2016-10-08 LAB — LIPID PANEL
CHOL/HDL RATIO: 4 ratio (ref 0.0–4.4)
Cholesterol, Total: 129 mg/dL (ref 100–199)
HDL: 32 mg/dL — AB (ref >39)
LDL CALC: 75 mg/dL (ref 0–99)
TRIGLYCERIDES: 112 mg/dL (ref 0–149)
VLDL Cholesterol Cal: 22 mg/dL (ref 5–40)

## 2016-10-08 LAB — VITAMIN D 25 HYDROXY (VIT D DEFICIENCY, FRACTURES): Vit D, 25-Hydroxy: 32.6 ng/mL (ref 30.0–100.0)

## 2016-10-08 LAB — TSH: TSH: 2.44 u[IU]/mL (ref 0.450–4.500)

## 2016-10-08 MED ORDER — LEVOTHYROXINE SODIUM 75 MCG PO TABS: ORAL_TABLET | ORAL | 12 refills | Status: DC

## 2016-10-11 LAB — CYTOLOGY - PAP: Diagnosis: NEGATIVE

## 2016-10-13 ENCOUNTER — Ambulatory Visit: Payer: BC Managed Care – PPO | Admitting: Family Medicine

## 2016-10-13 ENCOUNTER — Encounter: Payer: Self-pay | Admitting: Family Medicine

## 2016-10-13 ENCOUNTER — Ambulatory Visit (INDEPENDENT_AMBULATORY_CARE_PROVIDER_SITE_OTHER): Payer: BC Managed Care – PPO | Admitting: Family Medicine

## 2016-10-13 VITALS — BP 124/82 | HR 79 | Temp 98.9°F | Wt 182.8 lb

## 2016-10-13 DIAGNOSIS — B351 Tinea unguium: Secondary | ICD-10-CM

## 2016-10-13 DIAGNOSIS — L089 Local infection of the skin and subcutaneous tissue, unspecified: Secondary | ICD-10-CM | POA: Diagnosis not present

## 2016-10-13 MED ORDER — SULFAMETHOXAZOLE-TRIMETHOPRIM 800-160 MG PO TABS: 1.0000 | ORAL_TABLET | Freq: Two times a day (BID) | ORAL | 0 refills | Status: DC

## 2016-10-13 NOTE — Patient Instructions (Signed)
Do epsom soaks. Take the antibiotic until complete.   YOU HAVE AN APPOINT[facility] Monday July [facility]rd at 4 pm [facility].   If you notice fever, chills, pain, or any worsening signs of infection over the weekend then you will need to be seen at an urgent care.

## 2016-10-13 NOTE — Progress Notes (Signed)
   Subjective:    Patient ID: Leah Estrada, female    DOB: 07-09-1970, 46 y.o.   MRN: [id number]  HPI Chief Complaint  Patient presents with  . infected toenail    left big toe. monday noticied with pus. red rash. big toe possible infected and yellow and pinky toe   She is here with complaints of a 4 day history of left great toe itching, and drainage that is pus-like. States she has not had any pain. No numbness, tingling. Denies fever, chills, nausea, vomiting, diarrhea.   Denies injury. States she has been using tea tree oil on the nail for approximately 1 year for presumed fungal infection. She has not seen any about this. She also has complaints of thick and yellow nail on her pinky toe.   States she has soaked her foot in epsom salt, hydrogen peroxide, and applied tea tree oil and neosporin. States today it is draining a more clear fluid.   Denies history of diabetes or MRSA.   Tdap is up to date.   Reviewed allergies, medications, past medical, surgical, and social history.    Review of Systems Pertinent positives and negatives in the history of present illness.     Objective:   Physical Exam  Constitutional: She appears well-developed and well-nourished. No distress.  HENT:  Mouth/Throat: Oropharynx is clear and moist.  Musculoskeletal:       Left foot: There is normal range of motion, no tenderness, no bony tenderness, no swelling and normal capillary refill.  Great toe on left foot with red broken skin medially to nail bed. A clear-yellowish fluid is draining from the nail bed and medial aspect. Nail is thick and discolored. Mild edema. Normal sensation, cap refill, ROM and strength. Non tender.   5th toe with thick and dark yellowish nail.   Left foot otherwise normal.    BP 124/82   Pulse 79   Temp 98.9 F (37.2 C) (Oral)   Wt 182 lb 12.8 oz (82.9 kg)   LMP 09/19/2016 (Exact Date)   SpO2 98%   BMI 34.26 kg/m      Assessment & Plan:  Toe infection -  Plan: Ambulatory referral to Podiatry, WOUND CULTURE, sulfamethoxazole-trimethoprim (BACTRIM DS,SEPTRA DS) 800-160 MG tablet  Onychomycosis - Plan: Ambulatory referral to Podiatry  Discussed that she has an infection, not suspicious for osteomyelitis. Wound culture done and she Bactrim prescribed.  Referral to podiatry. She has an appointment Monday at 4 pm. She is aware that if she gets worse, develops fever, chills or pain over the weekend that she will need to go to an urgent care.

## 2016-10-14 ENCOUNTER — Ambulatory Visit: Payer: BC Managed Care – PPO | Admitting: Family Medicine

## 2016-10-16 LAB — WOUND CULTURE
Gram Stain: NONE SEEN
Gram Stain: NONE SEEN

## 2016-11-08 ENCOUNTER — Ambulatory Visit (INDEPENDENT_AMBULATORY_CARE_PROVIDER_SITE_OTHER): Payer: BC Managed Care – PPO | Admitting: Podiatry

## 2016-11-08 ENCOUNTER — Encounter: Payer: Self-pay | Admitting: Podiatry

## 2016-11-08 VITALS — BP 127/86 | HR 82 | Resp 18

## 2016-11-08 DIAGNOSIS — B351 Tinea unguium: Secondary | ICD-10-CM | POA: Diagnosis not present

## 2016-11-08 DIAGNOSIS — Z79899 Other long term (current) drug therapy: Secondary | ICD-10-CM | POA: Diagnosis not present

## 2016-11-08 LAB — CBC WITH DIFFERENTIAL/PLATELET
Basophils Absolute: 0 cells/uL (ref 0–200)
Basophils Relative: 0 %
Eosinophils Absolute: 432 cells/uL (ref 15–500)
Eosinophils Relative: 4 %
HCT: 38.8 % (ref 35.0–45.0)
HEMOGLOBIN: 12.8 g/dL (ref 11.7–15.5)
LYMPHS ABS: 3024 {cells}/uL (ref 850–3900)
Lymphocytes Relative: 28 %
MCH: 28.9 pg (ref 27.0–33.0)
MCHC: 33 g/dL (ref 32.0–36.0)
MCV: 87.6 fL (ref 80.0–100.0)
MONO ABS: 864 {cells}/uL (ref 200–950)
MPV: 10.1 fL (ref 7.5–12.5)
Monocytes Relative: 8 %
NEUTROS ABS: 6480 {cells}/uL (ref 1500–7800)
NEUTROS PCT: 60 %
Platelets: 259 10*3/uL (ref 140–400)
RBC: 4.43 MIL/uL (ref 3.80–5.10)
RDW: 14.2 % (ref 11.0–15.0)
WBC: 10.8 10*3/uL (ref 3.8–10.8)

## 2016-11-08 LAB — HEPATIC FUNCTION PANEL
ALT: 19 U/L (ref 6–29)
AST: 15 U/L (ref 10–35)
Albumin: 4.1 g/dL (ref 3.6–5.1)
Alkaline Phosphatase: 112 U/L (ref 33–115)
BILIRUBIN DIRECT: 0.2 mg/dL (ref <=0.2)
BILIRUBIN TOTAL: 0.7 mg/dL (ref 0.2–1.2)
Indirect Bilirubin: 0.5 mg/dL (ref 0.2–1.2)
Total Protein: 6.9 g/dL (ref 6.1–8.1)

## 2016-11-08 MED ORDER — TERBINAFINE HCL 250 MG PO TABS: 250.0000 mg | ORAL_TABLET | Freq: Every day | ORAL | 0 refills | Status: DC

## 2016-11-08 NOTE — Progress Notes (Signed)
   Subjective:    Patient ID: Leah Estrada, female    DOB: Jul 27, 1970, 46 y.o.   MRN: [id number]  HPI  46 year old female presents the office today for concerns of her left big toenail becoming thick and discolored. She presented to medical doctor and she has an infection and she was started on antibiotics for this. She did follow up with a podiatrist one week later and she did trim the nail and send a sample to see if there is any fungus present. She states that she finish the course of acne infection resolved but she did not follow-up with the doctor who did the pathology to see if there is nail fungus. She states that the nail is no longer painful and she denies any redness or drainage or any swelling to the area but is concerned about how thick and discolored the nail is. She's never had any treatment for nail fungus. She has no other concerns today.   Review of Systems  All other systems reviewed and are negative.      Objective:   Physical Exam General: AAO x3, NAD  Dermatological: Left hallux toenail present hypertrophic, dystrophic, discolored yellow to brown discoloration. There is also some discoloration and thickening to left fifth toenail of the same discoloration. There is no hyperpigmentation. There is no open lesions identified. There is no pain in the nails there is no clinical signs of infection noted today.  Vascular: Dorsalis Pedis artery and Posterior Tibial artery pedal pulses are 2/4 bilateral with immedate capillary fill time. There is no pain with calf compression, swelling, warmth, erythema.   Neruologic: Grossly intact via light touch bilateral.  Protective threshold with Semmes Wienstein monofilament intact to all pedal sites bilateral.   Musculoskeletal: No gross boney pedal deformities bilateral. No pain, crepitus, or limitation noted with foot and ankle range of motion bilateral. Muscular strength 5/5 in all groups tested bilateral.  Gait: Unassisted,  Nonantalgic.     Assessment & Plan:  46 year old female onychomycosis left hallux -Treatment options discussed including all alternatives, risks, and complications -Etiology of symptoms were discussed -I reviewed the pathology report that Dr. Harriet PhoAjlouny had performed. He did reveal onychomycosis, dermatophytes present. Previous wound culture was also reviewed which did grow Enterobacter Cloacae and she was on Bactrim for this and this did resolve this type of infection. -After long discussion with her regarding treatment options for nail fungus she is also pursues oral Lamisil. Discussed side effects the medication and she wishes to proceed. Today I did prescribe Lamisil for 3 months however prior to starting this medication she is to give blood work including liver function CBC. Do not start the medication until I color the results of the blood work and she understood this verbally. -Follow-up in 5 weeks or sooner if needed. Call any questions or concerns in the meantime.  Ovid CurdMatthew Amauris Debois, DPM

## 2016-11-08 NOTE — Patient Instructions (Signed)

## 2016-11-08 NOTE — Progress Notes (Signed)
   Subjective:    Patient ID: Leah MerrittsNancy Klaus, female    DOB: 10-Apr-1970, 46 y.o.   MRN: [id number]  HPI    Review of Systems     Objective:   Physical Exam        Assessment & Plan:

## 2016-11-09 ENCOUNTER — Telehealth: Payer: Self-pay

## 2016-11-09 NOTE — Telephone Encounter (Signed)
-----   Message from Vivi BarrackMatthew R Wagoner, DPM sent at 11/09/2016  9:37 AM EDT ----- Labs normal. Please let her know that she can start Lamisil. Thanks.

## 2016-11-09 NOTE — Telephone Encounter (Signed)
Spoke with patient advising her of normal lab results and use of tea tree oil with a carrier oil for a topical anti-fungal

## 2016-11-09 NOTE — Telephone Encounter (Signed)
LVM for patient to call to discuss lab results

## 2016-11-09 NOTE — Telephone Encounter (Signed)
-----   Message from Matthew R Wagoner, DPM sent at 11/09/2016  9:37 AM EDT ----- Labs normal. Please let her know that she can start Lamisil. Thanks. 

## 2016-11-10 DIAGNOSIS — B351 Tinea unguium: Secondary | ICD-10-CM | POA: Insufficient documentation

## 2016-11-11 ENCOUNTER — Telehealth: Payer: Self-pay | Admitting: Podiatry

## 2016-11-11 NOTE — Telephone Encounter (Signed)
I was seen on Monday and everything looked fine with my toe that I was seen about. Tuesday morning I woke up with it itching and it looks like it might have a blister or something on the toe. I had an infection before and one of the main affects from it was the itching. Could this mean I have another infection? Please call me back at [phone].

## 2016-11-11 NOTE — Telephone Encounter (Signed)
If not better please have her come in. She will likely need to have the nail taken off at this point.

## 2016-11-11 NOTE — Telephone Encounter (Signed)
Spoke with patient who states that she is afraid that her toe is becoming infected, Per our previous conversation, I advised her on use of tea tree oil with a carrier oil, she also states that she has been putting neosporin on her toe as well. I told her to stop the tea tree oil and neosporin, she states she has been soaking her toe in Epsom salt but the ratio of salt to water was much higher than prescribed. I told her to only soak in 1/4 cup of epsom salt to 2 quarts warm water once daily, stop any topical applications and continue with lamisil.

## 2016-11-16 ENCOUNTER — Ambulatory Visit (INDEPENDENT_AMBULATORY_CARE_PROVIDER_SITE_OTHER): Payer: BC Managed Care – PPO | Admitting: Podiatry

## 2016-11-16 ENCOUNTER — Encounter: Payer: Self-pay | Admitting: Podiatry

## 2016-11-16 DIAGNOSIS — L600 Ingrowing nail: Secondary | ICD-10-CM | POA: Diagnosis not present

## 2016-11-16 DIAGNOSIS — B351 Tinea unguium: Secondary | ICD-10-CM

## 2016-11-16 DIAGNOSIS — L02612 Cutaneous abscess of left foot: Secondary | ICD-10-CM

## 2016-11-16 DIAGNOSIS — L03032 Cellulitis of left toe: Secondary | ICD-10-CM | POA: Diagnosis not present

## 2016-11-16 MED ORDER — CEPHALEXIN 500 MG PO CAPS: 500.0000 mg | ORAL_CAPSULE | Freq: Three times a day (TID) | ORAL | 0 refills | Status: DC

## 2016-11-16 NOTE — Patient Instructions (Signed)

## 2016-11-16 NOTE — Progress Notes (Signed)
Subjective: Ms. Mulato presents to the office today for concerns of reoccurrence of infection to left big toenail. She states that she presented infection that did clear out however after she last saw me she started again redness and drainage and she hasn't pus coming from the toenail. She is improving and buttock with and along the area. She has tried Lamisil without any side effects. She has no new concerns. No recent injury or trauma.  Denies any systemic complaints such as fevers, chills, nausea, vomiting. No acute changes since last appointment, and no other complaints at this time.   Objective: AAO x3, NAD DP/PT pulses palpable bilaterally, CRT less than 3 seconds LEFT hallux toenail does be hypertrophic, dystrophic, discolored yellow-brown discoloration. Incurvation along the nail corners on the left hallux toenail. There is erythema on the entire toenail and there is tenderness the actual toenail itself as well as the surrounding nail corners. There is no drainage or pus expressed today there is no ascending cellulitis. There is no fluctuance or crepitus. There is no malodor.  No open lesions or pre-ulcerative lesions.  No pain with calf compression, swelling, warmth, erythema  Assessment: Reoccurrence of infection left hallux toenail   Plan: -All treatment options discussed with the patient including all alternatives, risks, complications.  -Discussed treatment options at this point.  -At this time, recommended total nail removal without chemical matricectomy to the left due to infection. Risks and complications were discussed with the patient for which they understand and  verbally consent to the procedure. Under sterile conditions a total of 3 mL of a mixture of 2% lidocaine plain and 0.5% Marcaine plain was infiltrated in a hallux block fashion. Once anesthetized, the skin was prepped in sterile fashion. A tourniquet was then applied. Next the hallux nail was sharply excised making sure  to remove the entire offending nail border. Once the nail was  Removed, the area was debrided and the underlying skin was intact. The area was irrigated and hemostasis was obtained.  A dry sterile dressing was applied. After application of the dressing the tourniquet was removed and there is found to be an immediate capillary refill time to the digit. The patient tolerated the procedure well any complications. Post procedure instructions were discussed the patient for which he verbally understood. Follow-up in one week for nail check or sooner if any problems are to arise. Discussed signs/symptoms of worsening infection and directed to call the office immediately should any occur or go directly to the emergency room. In the meantime, encouraged to call the office with any questions, concerns, changes symptoms. -Keflex -Patient encouraged to call the office with any questions, concerns, change in symptoms.   Ovid Curd, DPM

## 2016-11-22 ENCOUNTER — Encounter: Payer: Self-pay | Admitting: Podiatry

## 2016-11-22 ENCOUNTER — Ambulatory Visit (INDEPENDENT_AMBULATORY_CARE_PROVIDER_SITE_OTHER): Payer: Self-pay | Admitting: Podiatry

## 2016-11-22 DIAGNOSIS — B351 Tinea unguium: Secondary | ICD-10-CM

## 2016-11-22 DIAGNOSIS — Z79899 Other long term (current) drug therapy: Secondary | ICD-10-CM

## 2016-11-22 DIAGNOSIS — L600 Ingrowing nail: Secondary | ICD-10-CM

## 2016-11-22 NOTE — Patient Instructions (Signed)

## 2016-11-23 NOTE — Progress Notes (Signed)
Subjective: Leah Estrada is a 46 y.o.  female returns to office today for follow up evaluation after having left Hallux total tempoary nail avulsion performed. Patient has been soaking using epsom salts and applying topical antibiotic covered with bandaid daily. There is one area that she points to the nail she is concerned about. She denies any pus or drainage for once to make sure that this one area is not pus. Patient denies fevers, chills, nausea, vomiting. Denies any calf pain, chest pain, SOB.   Objective:  Vitals: Reviewed  General: Well developed, nourished, in no acute distress, alert and oriented x3   Dermatology: Skin is warm, dry and supple bilateral. Left hallux nail bed appears to be clean, dry, with mild granular tissue and surrounding scab. The area of concern does not reveal any areas of fluctuance or crepitus or drainage or pus. This appears to be a small scab to the area and there is no underlying signs of infection. There is no surrounding erythema, edema, drainage/purulence. The remaining nails appear unremarkable at this time. There are no other lesions or other signs of infection present.  Neurovascular status: Intact. No lower extremity swelling; No pain with calf compression bilateral.  Musculoskeletal: Decreased tenderness to palpation of the left hallux nail bed. Muscular strength within normal limits bilateral.   Assesement and Plan: S/p totalnail avulsion, doing well.   -Continue soaking in epsom salts twice a day followed by antibiotic ointment and a band-aid. Can leave uncovered at night. Continue this until completely healed.  -Finish course of antibiotics -I went ahead and gave her the paperwork to get repeat blood work done the middle of September. I'll keep her appointment for and a September if needed for now but may end up rescheduling if she is doing well. -If the area has not healed in 2 weeks, call the office for follow-up appointment, or sooner if any  problems arise.  -Monitor for any signs/symptoms of infection. Call the office immediately if any occur or go directly to the emergency room. Call with any questions/concerns.  Ovid Curd, DPM

## 2016-11-28 ENCOUNTER — Other Ambulatory Visit: Payer: Self-pay | Admitting: Certified Nurse Midwife

## 2016-11-28 DIAGNOSIS — E039 Hypothyroidism, unspecified: Secondary | ICD-10-CM

## 2016-12-15 LAB — CBC WITH DIFFERENTIAL/PLATELET
BASOS PCT: 0.4 %
Basophils Absolute: 40 cells/uL (ref 0–200)
EOS ABS: 550 {cells}/uL — AB (ref 15–500)
Eosinophils Relative: 5.5 %
HCT: 40.2 % (ref 35.0–45.0)
HEMOGLOBIN: 13 g/dL (ref 11.7–15.5)
Lymphs Abs: 2830 cells/uL (ref 850–3900)
MCH: 28.1 pg (ref 27.0–33.0)
MCHC: 32.3 g/dL (ref 32.0–36.0)
MCV: 87 fL (ref 80.0–100.0)
MPV: 10.6 fL (ref 7.5–12.5)
Monocytes Relative: 5.6 %
NEUTROS ABS: 6020 {cells}/uL (ref 1500–7800)
Neutrophils Relative %: 60.2 %
PLATELETS: 260 10*3/uL (ref 140–400)
RBC: 4.62 10*6/uL (ref 3.80–5.10)
RDW: 13.2 % (ref 11.0–15.0)
Total Lymphocyte: 28.3 %
WBC: 10 10*3/uL (ref 3.8–10.8)
WBCMIX: 560 {cells}/uL (ref 200–950)

## 2016-12-15 LAB — HEPATIC FUNCTION PANEL
AG RATIO: 1.7 (calc) (ref 1.0–2.5)
ALKALINE PHOSPHATASE (APISO): 120 U/L — AB (ref 33–115)
ALT: 18 U/L (ref 6–29)
AST: 16 U/L (ref 10–35)
Albumin: 4.4 g/dL (ref 3.6–5.1)
BILIRUBIN DIRECT: 0.1 mg/dL (ref 0.0–0.2)
Globulin: 2.6 g/dL (calc) (ref 1.9–3.7)
Indirect Bilirubin: 0.3 mg/dL (calc) (ref 0.2–1.2)
Total Bilirubin: 0.4 mg/dL (ref 0.2–1.2)
Total Protein: 7 g/dL (ref 6.1–8.1)

## 2016-12-16 ENCOUNTER — Telehealth: Payer: Self-pay | Admitting: Podiatry

## 2016-12-16 ENCOUNTER — Telehealth: Payer: Self-pay | Admitting: *Deleted

## 2016-12-16 DIAGNOSIS — Z79890 Hormone replacement therapy (postmenopausal): Secondary | ICD-10-CM

## 2016-12-16 NOTE — Telephone Encounter (Signed)
I was returning a phone call to [facility]. I told the pt that [name] had stepped away from her desk and I didn't know long she would be gone for. I gave pt Leah Estrada's direct phone number and told her I would leave a message letting [name] know that she called back.

## 2016-12-16 NOTE — Telephone Encounter (Signed)
Hi, I was calling to see if you got my blood work yet. I had it done yesterday and I'm supposed to see Dr. Ardelle Anton tomorrow at 7:30 am. It depends on the results if I come in or not because he told me if it was okay that I didn't have to come in. If you could call me and let me know when you usually get the results if you do not have them. My number is [phone]. Thank you.

## 2016-12-16 NOTE — Telephone Encounter (Addendum)
-----  Message from Trula Slade, DPM sent at 12/16/2016 11:52 AM EDT ----- Liver enzymes are normal but Alk phos is slightly elevated. Can you please just have her recheck the LFT? Thanks. Dr. Jacqualyn Posey states repeat hepatic function to make certain the alk phos is not continuing to elevate, may continue the lamisil. Left message informing pt of Dr. Leigh Aurora orders. Pt asked if she had to come by the office to pick up the labs and if she needed to keep tomorrow's appt. I told pt the lab orders would be at the lab and she did not need to come in tomorrow.

## 2016-12-17 ENCOUNTER — Ambulatory Visit: Payer: BC Managed Care – PPO | Admitting: Podiatry

## 2016-12-24 LAB — HEPATIC FUNCTION PANEL
AG Ratio: 1.5 (calc) (ref 1.0–2.5)
ALBUMIN MSPROF: 4 g/dL (ref 3.6–5.1)
ALT: 13 U/L (ref 6–29)
AST: 13 U/L (ref 10–35)
Alkaline phosphatase (APISO): 118 U/L — ABNORMAL HIGH (ref 33–115)
Bilirubin, Direct: 0.1 mg/dL (ref 0.0–0.2)
Globulin: 2.7 g/dL (calc) (ref 1.9–3.7)
Indirect Bilirubin: 0.3 mg/dL (calc) (ref 0.2–1.2)
TOTAL PROTEIN: 6.7 g/dL (ref 6.1–8.1)
Total Bilirubin: 0.4 mg/dL (ref 0.2–1.2)

## 2016-12-30 ENCOUNTER — Telehealth: Payer: Self-pay | Admitting: *Deleted

## 2016-12-30 NOTE — Telephone Encounter (Addendum)
-----   Message from Vivi Barrack, DPM sent at 12/28/2016  3:04 PM EDT ----- Blood work is improved. Please let her know.12/28/2016-I informed pt of Dr. Gabriel Rung review of results. Pt states understanding and asked if she was to continue soaking. I told pt if the area had a dry hard scab, to perform today's last soak, leave off antibiotic ointment and if continued to have a dry hard scab could stop.

## 2017-01-24 ENCOUNTER — Ambulatory Visit (INDEPENDENT_AMBULATORY_CARE_PROVIDER_SITE_OTHER): Payer: BC Managed Care – PPO | Admitting: Podiatry

## 2017-01-24 ENCOUNTER — Encounter: Payer: Self-pay | Admitting: Podiatry

## 2017-01-24 DIAGNOSIS — B351 Tinea unguium: Secondary | ICD-10-CM

## 2017-01-24 DIAGNOSIS — Z79899 Other long term (current) drug therapy: Secondary | ICD-10-CM

## 2017-01-24 MED ORDER — TERBINAFINE HCL 250 MG PO TABS: 250.0000 mg | ORAL_TABLET | Freq: Every day | ORAL | 0 refills | Status: DC

## 2017-01-24 NOTE — Patient Instructions (Signed)

## 2017-01-24 NOTE — Progress Notes (Signed)
Subjective: Leah Estrada presents the office if up evaluation onychomycosis as well as for total nail avulsion a left big toe. She said that she's having no pain to the left big toe she has been using is also as well as Neosporin and a bandage. She was starting hole blistering from this point she stopped it but she has not noticed any swelling or redness or any drainage coming from the toenail site. She's having a reaction or side effect taking the Lamisil. She has no other concerns today. Denies any systemic complaints such as fevers, chills, nausea, vomiting. No acute changes since last appointment, and no other complaints at this time.   Objective: AAO x3, NAD DP/PT pulses palpable bilaterally, CRT less than 3 seconds The left hallux toenail appears to be growing back and however it is still somewhat thickened discolored the base of the nail but is coming back although slowly. There is no pain of the nail has noticed any redness or drainage or any clinical signs of infection present. No pain to the area. No other areas of tenderness identified bilaterally. There is no areas of skin rash or reaction. No open lesions or pre-ulcerative lesions.  No pain with calf compression, swelling, warmth, erythema  Assessment: Left hallux status post total nail avulsion with onychomycosis   Plan: -All treatment options discussed with the patient including all alternatives, risks, complications.  -At this point the nail has grown back in soapy be somewhat thickened. She's having no side effects the Lamisil. The nail is growing back and is still somewhat thickened discolored we will continue Lamisil for fourth month. We will recheck a CBC and LFT foot decubitus and over 30 more pills to her pharmacy. She will get blood work done this week. Continues monitoring side effects.  -After she finishes her 4 months of the Lamisil we will the nail grow out. Discussed other ways to help with fungus including vinegar soaks.   -Patient encouraged to call the office with any questions, concerns, change in symptoms.   Leah CurdMatthew Estrada, DPM

## 2017-01-28 LAB — CBC WITH DIFFERENTIAL/PLATELET
BASOS ABS: 37 {cells}/uL (ref 0–200)
BASOS PCT: 0.5 %
EOS PCT: 3.8 %
Eosinophils Absolute: 281 cells/uL (ref 15–500)
HCT: 38.1 % (ref 35.0–45.0)
HEMOGLOBIN: 12.7 g/dL (ref 11.7–15.5)
Lymphs Abs: 2309 cells/uL (ref 850–3900)
MCH: 29 pg (ref 27.0–33.0)
MCHC: 33.3 g/dL (ref 32.0–36.0)
MCV: 87 fL (ref 80.0–100.0)
MONOS PCT: 7.3 %
MPV: 10.5 fL (ref 7.5–12.5)
NEUTROS ABS: 4233 {cells}/uL (ref 1500–7800)
Neutrophils Relative %: 57.2 %
PLATELETS: 233 10*3/uL (ref 140–400)
RBC: 4.38 10*6/uL (ref 3.80–5.10)
RDW: 12.6 % (ref 11.0–15.0)
Total Lymphocyte: 31.2 %
WBC mixed population: 540 cells/uL (ref 200–950)
WBC: 7.4 10*3/uL (ref 3.8–10.8)

## 2017-01-28 LAB — HEPATIC FUNCTION PANEL
AG Ratio: 1.4 (calc) (ref 1.0–2.5)
ALKALINE PHOSPHATASE (APISO): 110 U/L (ref 33–115)
ALT: 14 U/L (ref 6–29)
AST: 13 U/L (ref 10–35)
Albumin: 4.1 g/dL (ref 3.6–5.1)
Bilirubin, Direct: 0.1 mg/dL (ref 0.0–0.2)
Globulin: 2.9 g/dL (calc) (ref 1.9–3.7)
Indirect Bilirubin: 0.5 mg/dL (calc) (ref 0.2–1.2)
TOTAL PROTEIN: 7 g/dL (ref 6.1–8.1)
Total Bilirubin: 0.6 mg/dL (ref 0.2–1.2)

## 2017-01-31 ENCOUNTER — Telehealth: Payer: Self-pay | Admitting: *Deleted

## 2017-01-31 NOTE — Telephone Encounter (Addendum)
-----   Message from Vivi BarrackMatthew R Wagoner, DPM sent at 01/31/2017  8:06 AM EST ----- Please let her know that her blood work is normal and can finish course of lamisil. Thanks. Left message informing pt of Dr. Gabriel RungWagoner's review of results and orders.

## 2017-02-28 ENCOUNTER — Telehealth: Payer: Self-pay | Admitting: Certified Nurse Midwife

## 2017-02-28 NOTE — Telephone Encounter (Signed)
Patient called and cancelled her appointment on 03/01/17 for vaginal itching because she is feeling better. Routing to provider for FYI only.

## 2017-03-01 ENCOUNTER — Ambulatory Visit: Payer: BC Managed Care – PPO | Admitting: Certified Nurse Midwife

## 2017-03-16 ENCOUNTER — Ambulatory Visit: Payer: BC Managed Care – PPO | Admitting: Family Medicine

## 2017-03-16 ENCOUNTER — Encounter: Payer: Self-pay | Admitting: Family Medicine

## 2017-03-16 VITALS — BP 138/86 | HR 80 | Temp 98.4°F | Ht 61.0 in | Wt 183.6 lb

## 2017-03-16 DIAGNOSIS — J0140 Acute pansinusitis, unspecified: Secondary | ICD-10-CM | POA: Diagnosis not present

## 2017-03-16 MED ORDER — AMOXICILLIN 875 MG PO TABS: 875.0000 mg | ORAL_TABLET | Freq: Two times a day (BID) | ORAL | 0 refills | Status: DC

## 2017-03-16 NOTE — Progress Notes (Signed)
Chief Complaint  Patient presents with  . Cough    and sinus pain and pressure. Mucus was yellow at onset but is more clear now. Doesn't think she has had any fevers but has not checked. No chills or body aches. Symptoms began Saturday night.     Subjective:  Leah Estrada is a 46 y.o. female who presents for a 5 day history of nasal congestion, sinus pressure, thick purulent nasal drainage, ears with pressure and productive cough.   Denies fever, chills, body aches, chest pain, shortness of breath or wheezing. Does not smoke. No recent antibiotics.  Denies history of asthma, bronchitis or pneumonia.   Treatment to date: cough suppressants and decongestants.  Denies sick contacts.  No other aggravating or relieving factors.  No other c/o.  ROS as in subjective.   Objective: Vitals:   03/16/17 1407  BP: 138/86  Pulse: 80  Temp: 98.4 F (36.9 C)    General appearance: Alert, WD/WN, no distress, mildly ill appearing                             Skin: warm, no rash                           Head: + frontal and maxillary sinus tenderness R>L                            Eyes: conjunctiva normal, corneas clear, PERRLA                            Ears: pearly TMs, external ear canals normal                          Nose: septum midline, turbinates swollen, with erythema and thick discharge             Mouth/throat: MMM, tongue normal, moderate pharyngeal erythema                           Neck: supple, no adenopathy, no thyromegaly, nontender                          Heart: RRR, normal S1, S2, no murmurs                         Lungs: CTA bilaterally, no wheezes, rales, or rhonchi      Assessment: Acute non-recurrent pansinusitis - Plan: amoxicillin (AMOXIL) 875 MG tablet    Plan: Discussed diagnosis and treatment of acute sinusitis.  Amoxil sent to pharmacy. Suggested symptomatic OTC remedies.Nasal saline spray for congestion.  Tylenol or Ibuprofen OTC for fever and malaise.   Call/return if worse or not back to baseline after completing the antibiotic.

## 2017-06-28 ENCOUNTER — Ambulatory Visit: Payer: BC Managed Care – PPO | Admitting: Podiatry

## 2017-06-28 DIAGNOSIS — L600 Ingrowing nail: Secondary | ICD-10-CM | POA: Diagnosis not present

## 2017-06-28 MED ORDER — CEPHALEXIN 500 MG PO CAPS: 500.0000 mg | ORAL_CAPSULE | Freq: Three times a day (TID) | ORAL | 0 refills | Status: DC

## 2017-06-28 NOTE — Patient Instructions (Signed)

## 2017-06-29 NOTE — Progress Notes (Signed)
Subjective: Leah Estrada presents the office today for concerns of a recurrent ingrown since the left big toe which is been ongoing for about 1 week, points to the lateral border.  She points to the tip of the toe where she has discomfort.  She is been soaking Epsom salts starting last night.  This of the nail was removed last year.  She has not had any treatment for the nail fungus recently.  She denies any drainage or pus coming from the toenail site.  She states that since stopping lisinopril is also getting a little bit better.  She has no other concerns.  No recent injury. Denies any systemic complaints such as fevers, chills, nausea, vomiting. No acute changes since last appointment, and no other complaints at this time.   Objective: AAO x3, NAD DP/PT pulses palpable bilaterally, CRT less than 3 seconds Mostly on the distal portion of the nail.  There is no drainage or pus expressed.  There is no fluctuation or crepitation.  There is no ascending cellulitis.  No malodor.  There is localized edema and erythema to the distal lateral portion of the left hallux toenail and this is where there is tenderness palpation and there is incurvation present.  There is no fluctuation or crepitation.  There is no malodor.No open lesions or pre-ulcerative lesions.  No pain with calf compression, swelling, warmth, erythema  Assessment: Ingrown toenail with localized erythema likely from inflammation left lateral hallux  Plan: -All treatment options discussed with the patient including all alternatives, risks, complications.  -We discussed partial nail avulsion but she is hesitant to do this.  Discussed with her nail debridement.  She said the nail removed previously we will try to debride the nail but if symptoms continue she did have a partial nail avulsion.  I was able to sharply debride the significant portion of ingrown toenails without any complications or bleeding.  Unable to remove a large piece of nail from the  distal lateral nail corner.  There is no purulence expressed.  Continue Epsom salt soaks twice a day, ointment and a Band-Aid.  Prescribed Keflex.  Follow-up in 2 weeks or sooner if needed.  Call any questions or concerns.  She agrees with this plan. -Patient encouraged to call the office with any questions, concerns, change in symptoms.   Vivi BarrackMatthew R Maree Ainley DPM

## 2017-07-12 ENCOUNTER — Ambulatory Visit: Payer: BC Managed Care – PPO | Admitting: Podiatry

## 2017-08-15 ENCOUNTER — Encounter

## 2017-08-15 ENCOUNTER — Ambulatory Visit: Payer: BC Managed Care – PPO | Admitting: Podiatry

## 2017-08-15 DIAGNOSIS — L600 Ingrowing nail: Secondary | ICD-10-CM | POA: Diagnosis not present

## 2017-08-15 NOTE — Patient Instructions (Signed)

## 2017-08-16 DIAGNOSIS — L600 Ingrowing nail: Secondary | ICD-10-CM | POA: Insufficient documentation

## 2017-08-16 NOTE — Progress Notes (Signed)
Subjective: 47 year old female presents the office today for concerns of localized erythema along the medial aspect left hallux toenail.  She said the lateral aspect is doing well.  Denies any drainage or pus coming from the area.  She has been soaking in Epsom salts as well as keeping Neosporin with Band-Aid overlying the area.  No recent injury. Denies any systemic complaints such as fevers, chills, nausea, vomiting. No acute changes since last appointment, and no other complaints at this time.   Objective: AAO x3, NAD DP/PT pulses palpable bilaterally, CRT less than 3 seconds There is incurvation present both medial lateral aspects of the left hallux toenail there is only tenderness on the medial aspect today.  There is localized, minimal erythema but there is no drainage or pus there is no ascending cellulitis.  There is no fluctuation or crepitation or any malodor.  No open lesions or pre-ulcerative lesions.  No pain with calf compression, swelling, warmth, erythema  Assessment: Ingrown toenail left hallux  Plan: -All treatment options discussed with the patient including all alternatives, risks, complications.  -At this point this is been ongoing issue at this point I recommended a partial nail avulsion with chemical matricectomy but she was hesitant to do this today.  She also went to work and she was nervous that she would not be able to do this.  I did do a debridement of the nail today and try to remove as much of the ingrowing of the toenails possible.  Will do Epson salt soaks as well as antibiotic ointment and a bandage daily.  Will hold off on any oral antibiotics at this point I think it is more inflamed as opposed to infection.  We will plan on doing a partial nail avulsion with chemical matricectomy next Friday for at her convenience. Watch for signs or symptoms of infection and let me know if there is any issues. -Patient encouraged to call the office with any questions, concerns,  change in symptoms.   Vivi Barrack DPM

## 2017-08-26 ENCOUNTER — Ambulatory Visit (INDEPENDENT_AMBULATORY_CARE_PROVIDER_SITE_OTHER): Payer: BC Managed Care – PPO | Admitting: Podiatry

## 2017-08-26 ENCOUNTER — Telehealth: Payer: Self-pay | Admitting: Podiatry

## 2017-08-26 ENCOUNTER — Encounter: Payer: Self-pay | Admitting: Podiatry

## 2017-08-26 DIAGNOSIS — L600 Ingrowing nail: Secondary | ICD-10-CM

## 2017-08-26 NOTE — Telephone Encounter (Signed)
Pt asked if she needed to leave a bandaid on to take a shower. I told pt it would be fine to shower with the bandaid on it would loosen the scab and make it easier for the dressing to come off and then could soak and redress after the shower.

## 2017-08-26 NOTE — Patient Instructions (Signed)

## 2017-08-26 NOTE — Telephone Encounter (Signed)
I just saw Dr. Ardelle Anton at 10 am for an AP nail procedure. I forgot to ask about taking a shower. When I'm taking a shower do I keep it covered or uncovered? If you could please give me a call at [phone]. Thank you.

## 2017-08-29 NOTE — Progress Notes (Signed)
Subjective: 47 year old female presents the office today to have her nail plate is removed from her left big toe.  She states that both nail corners been becoming problematic she wishes to have both corners removed at this time.  She states that it fluctuates between both the medial and lateral corners and she points to this is been ongoing issue.  This point she wished to proceed with the procedure.  She currently denies any drainage or pus.  Denies any systemic complaints such as fevers, chills, nausea, vomiting. No acute changes since last appointment, and no other complaints at this time.   Objective: AAO x3, NAD DP/PT pulses palpable bilaterally, CRT less than 3 seconds There is incurvation present at the medial lateral aspects of the left hallux toenail with minimal edema there is no significant erythema or ascending cellulitis.  There is no fluctuation or crepitation or any malodor. No open lesions or pre-ulcerative lesions.  No pain with calf compression, swelling, warmth, erythema  Assessment: Chronic ingrown toenails left hallux.  Plan: -All treatment options discussed with the patient including all alternatives, risks, complications.  -At this time, the patient is requesting partial nail removal with chemical matricectomy to the symptomatic portion of the nail. Risks and complications were discussed with the patient for which they understand and written consent was obtained. Under sterile conditions a total of 3 mL of a mixture of 2% lidocaine plain and 0.5% Marcaine plain was infiltrated in a hallux block fashion. Once anesthetized, the skin was prepped in sterile fashion. A tourniquet was then applied. Next the medial and lateral aspect of hallux nail border was then sharply excised making sure to remove the entire offending nail border. Once the nails were ensured to be removed area was debrided and the underlying skin was intact. There is no purulence identified in the procedure. Next  phenol was then applied under standard conditions and copiously irrigated. Silvadene was applied. A dry sterile dressing was applied. After application of the dressing the tourniquet was removed and there is found to be an immediate capillary refill time to the digit. The patient tolerated the procedure well any complications. Post procedure instructions were discussed the patient for which he verbally understood. Follow-up in one week for nail check or sooner if any problems are to arise. Discussed signs/symptoms of infection and directed to call the office immediately should any occur or go directly to the emergency room. In the meantime, encouraged to call the office with any questions, concerns, changes symptoms. -Patient encouraged to call the office with any questions, concerns, change in symptoms.   Vivi BarrackMatthew R Donnald Tabar DPM

## 2017-08-30 ENCOUNTER — Ambulatory Visit (INDEPENDENT_AMBULATORY_CARE_PROVIDER_SITE_OTHER): Payer: BC Managed Care – PPO | Admitting: Podiatry

## 2017-08-30 DIAGNOSIS — L539 Erythematous condition, unspecified: Secondary | ICD-10-CM

## 2017-08-30 DIAGNOSIS — Z9889 Other specified postprocedural states: Secondary | ICD-10-CM

## 2017-08-30 DIAGNOSIS — L600 Ingrowing nail: Secondary | ICD-10-CM

## 2017-08-30 MED ORDER — CEPHALEXIN 500 MG PO CAPS: 500.0000 mg | ORAL_CAPSULE | Freq: Three times a day (TID) | ORAL | 2 refills | Status: DC

## 2017-08-30 NOTE — Patient Instructions (Signed)
Continue soaking in epsom salts twice a day followed by antibiotic ointment and a band-aid. Can leave uncovered at night. Continue this until completely healed.  Monitor for any signs/symptoms of infection. Call the office immediately if any occur or go directly to the emergency room. Call with any questions/concerns.  

## 2017-08-31 NOTE — Progress Notes (Signed)
Subjective: 47 year old 58female presents the office today she is concerned about some redness around the left toenail after she had a partial nail avulsion of both corners performed on Friday.  She states that she may be saw some pus last night but she is not sure.  Her pain is minimal.  Denies any red streaks.  Overall she feels well and she denies any systemic complaints including fevers, chills, nausea, vomiting. No acute changes since last appointment, and no other complaints at this time.   Objective: AAO x3, NAD DP/PT pulses palpable bilaterally, CRT less than 3 seconds Status post medial lateral partial nail avulsion of the left hallux toenail.  There is granulation tissue present as well as a scab starting to form.  There is faint edema and erythema on the nail corners but there is no ascending cellulitis.  Also minimal erythema on the proximal nail corner.  There is no drainage or pus expressed today.  There is no fluctuation or crepitation.  There is no malodor.   No open lesions or pre-ulcerative lesions.  No pain with calf compression, swelling, warmth, erythema  Assessment: Toe erythema status post partial nail avulsion with chemical matricectomy  Plan: -All treatment options discussed with the patient including all alternatives, risks, complications.  -Given erythema or redness start antibiotics I prescribed Keflex for her.  I want her to continue Epsom salts soaks as well as antibiotic ointment and a dressing daily.  She can have the area uncovered at night.  Monitoring signs or symptoms of worsening infection to call the office immediately should any occur or go to the emergency room.  I also think a component of the erythema is due to the phenol. -Patient encouraged to call the office with any questions, concerns, change in symptoms.   Leah BarrackMatthew R Wagoner DPM

## 2017-09-06 ENCOUNTER — Ambulatory Visit (INDEPENDENT_AMBULATORY_CARE_PROVIDER_SITE_OTHER): Payer: BC Managed Care – PPO | Admitting: Podiatry

## 2017-09-06 ENCOUNTER — Encounter: Payer: Self-pay | Admitting: Podiatry

## 2017-09-06 DIAGNOSIS — L600 Ingrowing nail: Secondary | ICD-10-CM

## 2017-09-06 DIAGNOSIS — L539 Erythematous condition, unspecified: Secondary | ICD-10-CM

## 2017-09-06 DIAGNOSIS — Z9889 Other specified postprocedural states: Secondary | ICD-10-CM

## 2017-09-06 NOTE — Patient Instructions (Signed)

## 2017-09-07 NOTE — Progress Notes (Signed)
Subjective: 47 year old female presents the office today for follow-up evaluation of left hallux, partial nail avulsion to both corners. She has been taking the antibiotic without complications. She states the toe is doing much better. She has not had any drainage or pus. Minimal pain. No red streaks. Redness and swelling has improved. Overall she feels well and she denies any systemic complaints including fevers, chills, nausea, vomiting. No acute changes since last appointment, and no other complaints at this time.   Objective: AAO x3, NAD DP/PT pulses palpable bilaterally, CRT less than 3 seconds Status post medial lateral partial nail avulsion of the left hallux toenail. Granulation tissue and scab is overlying the procedure site. There is much improved erythema along the nail corners. No pus, drainage. No ascending cellulitis. No fluctuation or crepitation. No pain to the procedure sites.  No open lesions or pre-ulcerative lesions.  No pain with calf compression, swelling, warmth, erythema  Assessment: Toe erythema status post partial nail avulsion with chemical matricectomy with improvement.   Plan: -All treatment options discussed with the patient including all alternatives, risks, complications.  -Continue soaking in epsom salts twice a day followed by antibiotic ointment and a band-aid. Can leave uncovered at night. Continue this until completely healed.  -Finish course of antibiotics.  If the area has not healed in 2 weeks, call the office for follow-up appointment, or sooner if any problems arise.  Monitor for any signs/symptoms of infection. Call the office immediately if any occur or go directly to the emergency room. Call with any questions/concerns.  Vivi BarrackMatthew R Issabella Rix DPM

## 2017-09-15 ENCOUNTER — Encounter: Payer: Self-pay | Admitting: Family Medicine

## 2017-09-15 ENCOUNTER — Ambulatory Visit: Payer: BC Managed Care – PPO | Admitting: Family Medicine

## 2017-09-15 VITALS — BP 122/80 | HR 76 | Temp 98.5°F | Resp 16 | Wt 185.6 lb

## 2017-09-15 DIAGNOSIS — R05 Cough: Secondary | ICD-10-CM

## 2017-09-15 DIAGNOSIS — R0982 Postnasal drip: Secondary | ICD-10-CM | POA: Diagnosis not present

## 2017-09-15 DIAGNOSIS — J069 Acute upper respiratory infection, unspecified: Secondary | ICD-10-CM

## 2017-09-15 DIAGNOSIS — R059 Cough, unspecified: Secondary | ICD-10-CM

## 2017-09-15 MED ORDER — AZITHROMYCIN 250 MG PO TABS: ORAL_TABLET | ORAL | 0 refills | Status: DC

## 2017-09-15 NOTE — Patient Instructions (Signed)
Continue taking the Mucinex. Take the Sudafed if needed.  You may wait and see if your symptoms improve over the weekend and hold off on the antibiotic.   Drink plenty of water.

## 2017-09-15 NOTE — Progress Notes (Signed)
Chief Complaint  Patient presents with  . sick    sick for a bout a week, chest congestion, deep cough, scratchy and full throat but not sore, no sinus pressure, sound nasaly    Subjective:  Leah Estrada is a 47 y.o. female who presents for a one week history of sore throat, hoarseness, chest congestion, coughing, mainly a dry cough. Symptoms are worsening and now she reports rhinorrhea, nasal congestion.   Denies fever, chills, headache, sinus pain, chest pain, palpitations, shortness of breath, abdominal pain, back pain, N/V/D.   Treatment to date: Sudafed, Mucinex .  Denies sick contacts.  No other aggravating or relieving factors.  No other c/o.  ROS as in subjective.   Objective: Vitals:   09/15/17 1036  BP: 122/80  Pulse: 76  Resp: 16  Temp: 98.5 F (36.9 C)  SpO2: 98%    General appearance: Alert, WD/WN, no distress, mildly ill appearing                             Skin: warm, no rash                           Head: no sinus tenderness                            Eyes: conjunctiva normal, corneas clear, PERRLA                            Ears: pearly TMs, external ear canals normal                          Nose: septum midline, turbinates swollen, with erythema and clear discharge             Mouth/throat: MMM, tongue normal, mild pharyngeal erythema                           Neck: supple, no adenopathy, no thyromegaly, nontender                          Heart: RRR, normal S1, S2, no murmurs                         Lungs: CTA bilaterally, no wheezes, rales, or rhonchi      Assessment: Cough  Post-nasal drainage  Acute URI    Plan: Discussed diagnosis and treatment of URI. Z-pak prescribed and she may hold off on this until Saturday or Sunday and see if she is improving on her own. Will start antibiotic if she develops fever or worsening symptoms. Suggested symptomatic OTC remedies such as Mucinex and Sudafed. Nasal saline spray for congestion.  Tylenol or  Ibuprofen OTC for fever and malaise.  Call/return if not back to baseline day 10 after starting the antibiotic if she needs to do so.

## 2017-09-22 ENCOUNTER — Encounter: Payer: Self-pay | Admitting: Podiatry

## 2017-09-22 ENCOUNTER — Ambulatory Visit (INDEPENDENT_AMBULATORY_CARE_PROVIDER_SITE_OTHER): Payer: BC Managed Care – PPO | Admitting: Podiatry

## 2017-09-22 DIAGNOSIS — L600 Ingrowing nail: Secondary | ICD-10-CM

## 2017-09-24 NOTE — Progress Notes (Signed)
Subjective: 47 year old female presents the office with concerns of her left big toenail, the lateral aspect certainly occurred back into the skin she thinks.  She denies any pain to the toe but she wants to have the area checked.  She denies any drainage or pus coming from the area.  No recent injury. Denies any systemic complaints such as fevers, chills, nausea, vomiting. No acute changes since last appointment, and no other complaints at this time.   Objective: AAO x3, NAD DP/PT pulses palpable bilaterally, CRT less than 3 seconds Status post partial nail avulsion of bilateral corners of the left hallux toenail.  On the proximal aspect looks good but the nail is dystrophic and discolored and towards the distal portion of it starts to have pain on and out words and there is trying to get some incurvation along the distal lateral portion of the nail.  There is no pain to this area there is no drainage or pus.  There is faint erythema ports coming into the skin but there is no ascending sialitis or increase in warmth and this is more from inflammation as opposed to infection. No open lesions or pre-ulcerative lesions.  No pain with calf compression, swelling, warmth, erythema  Assessment: Ingrown toenail left hallux  Plan: -All treatment options discussed with the patient including all alternatives, risks, complications.  -We discussed options for nail.  Ultimately we did seem to remove the entire toenail with chemical matricectomy however she is can be going to the beach coming up to hold off on this until after the summer we need to.  I did sharply debride this and not partially ingrown toenail with any complications or bleeding.  She can also continue with keep the nail trimmed to help prevent this from happening.  She can continue with Epson salt soaks which she has not been doing she was doing well up until recently.  Monitor for any signs or symptoms of infection. -Patient encouraged to call  the office with any questions, concerns, change in symptoms.   Vivi BarrackMatthew R Idalee Foxworthy DPM

## 2017-10-12 NOTE — Progress Notes (Signed)
47 y.o. [id number] Married  Caucasian Fe here for annual exam. Periods sporadic with IUD. Saw PCP for virus with cough, has resolved. Has been having issues with left great toe infection seeing Podiatrist for treatment and removal of toe nail. Still not healed. Denies any thyroid changes, feels like it is working "as always". Thought she had noted some vaginal odor, but no itching or burning. Please check.  No other health issues today. Screening labs today.   Patient's last menstrual period was 09/26/2017 (exact date).          Sexually active: Yes.    The current method of family planning is IUD.    Exercising: No.  exercise Smoker:  no  Health Maintenance: Pap:  10-07-16 negative           10-07-15 negative, HR HPV negative  History of Abnormal Pap: yes MMG:  10-12-16 density b/BIRADS 1 negative, having mammogram today Self Breast exams: yes Colonoscopy:  none BMD:   none TDaP:  2017  Shingles: no Pneumonia: no Hep C and HIV: Hep c neg 2017 Labs: yes   reports that she has never smoked. She has never used smokeless tobacco. She reports that she does not drink alcohol or use drugs.  Past Medical History:  Diagnosis Date  . Abnormal Pap smear of cervix    2015, 2016 ASCUS HPV HR neg  . Thyroid disease    hypothyroidism    Past Surgical History:  Procedure Laterality Date  . INTRAUTERINE DEVICE INSERTION     inserted 02/02/11, removal 02/01/21    Current Outpatient Medications  Medication Sig Dispense Refill  . Fexofenadine HCl (ALLEGRA PO) Take by mouth daily.    Marland Kitchen. levothyroxine (SYNTHROID, LEVOTHROID) 75 MCG tablet TAKE 1 TABLET EVERY DAY 30 tablet 12  . PARAGARD INTRAUTERINE COPPER IU by Intrauterine route.    . Pseudoephedrine HCl (SUDAFED PO) Take by mouth as needed. Reported on 10/07/2015     No current facility-administered medications for this visit.     Family History  Problem Relation Age of Onset  . Breast cancer Mother   . Heart disease Mother   .  Hyperlipidemia Mother   . Heart disease Father   . Breast cancer Maternal Aunt   . Heart disease Maternal Grandmother     ROS:  Pertinent items are noted in HPI.  Otherwise, a comprehensive ROS was negative.  Exam:   BP 104/62   Pulse 70   Resp 16   Ht 5' 0.75" (1.543 m)   Wt 185 lb (83.9 kg)   LMP 09/26/2017 (Exact Date)   BMI 35.24 kg/m  Height: 5' 0.75" (154.3 cm) Ht Readings from Last 3 Encounters:  10/13/17 5' 0.75" (1.543 m)  03/16/17 5\' 1"  (1.549 m)  10/07/16 5' 1.25" (1.556 m)    General appearance: alert, cooperative and appears stated age Head: Normocephalic, without obvious abnormality, atraumatic Neck: no adenopathy, supple, symmetrical, trachea midline and thyroid normal to inspection and palpation Lungs: clear to auscultation bilaterally Breasts: normal appearance, no masses or tenderness, No nipple retraction or dimpling, No nipple discharge or bleeding, No axillary or supraclavicular adenopathy Heart: regular rate and rhythm Abdomen: soft, non-tender; no masses,  no organomegaly Extremities: extremities normal, atraumatic, no cyanosis or edema Skin: Skin color, texture, turgor normal. No rashes or lesions Lymph nodes: Cervical, supraclavicular, and axillary nodes normal. No abnormal inguinal nodes palpated Neurologic: Grossly normal   Pelvic: External genitalia:  no lesions  Urethra:  normal appearing urethra with no masses, tenderness or lesions              Bartholin's and Skene's: normal                 Vagina: normal appearing vagina with normal color and discharge, no lesions              Cervix: multiparous appearance, no cervical motion tenderness, no lesions and IUD string noted in cervix              Pap taken: No. Bimanual Exam:  Uterus:  normal size, contour, position, consistency, mobility, non-tender and anteverted              Adnexa: normal adnexa and no mass, fullness, tenderness               Rectovaginal: Confirms                Anus:  normal sphincter tone, no lesions  Wet Prep KOH Saline negative for pathogens  Chaperone present: yes  A:  Well Woman with normal exam  Contraception Paragard IUD due for removal 02/01/21  R/o vaginal infection, negative wet prep  Hypothyroid on stable medication  Screening labs  Colonoscopy due  P:   Reviewed health and wellness pertinent to exam  Risks/benefits/warning signs of IUD reviewed  Discussed wet prep negative for infection  Labs: CMP,Lipid panel, Hgb A1-C, Vitamin D, TSH  Discussed risks/benefits of colonoscopy, declines scheduling. IFOB dispensed with instructions  Pap smear: no   counseled on breast self exam, mammography screening, feminine hygiene, adequate intake of calcium and vitamin D, diet and exercis return annually or prn  An After Visit Summary was printed and given to the patient.

## 2017-10-13 ENCOUNTER — Encounter: Payer: Self-pay | Admitting: Certified Nurse Midwife

## 2017-10-13 ENCOUNTER — Ambulatory Visit: Payer: BC Managed Care – PPO | Admitting: Certified Nurse Midwife

## 2017-10-13 ENCOUNTER — Other Ambulatory Visit: Payer: Self-pay

## 2017-10-13 VITALS — BP 104/62 | HR 70 | Resp 16 | Ht 60.75 in | Wt 185.0 lb

## 2017-10-13 DIAGNOSIS — Z01419 Encounter for gynecological examination (general) (routine) without abnormal findings: Secondary | ICD-10-CM | POA: Diagnosis not present

## 2017-10-13 DIAGNOSIS — Z0000 Encounter for general adult medical examination without abnormal findings: Secondary | ICD-10-CM

## 2017-10-13 DIAGNOSIS — E663 Overweight: Secondary | ICD-10-CM | POA: Diagnosis not present

## 2017-10-13 DIAGNOSIS — Z30431 Encounter for routine checking of intrauterine contraceptive device: Secondary | ICD-10-CM

## 2017-10-13 DIAGNOSIS — E038 Other specified hypothyroidism: Secondary | ICD-10-CM

## 2017-10-13 DIAGNOSIS — E559 Vitamin D deficiency, unspecified: Secondary | ICD-10-CM | POA: Diagnosis not present

## 2017-10-13 DIAGNOSIS — N898 Other specified noninflammatory disorders of vagina: Secondary | ICD-10-CM

## 2017-10-13 NOTE — Patient Instructions (Signed)

## 2017-10-14 ENCOUNTER — Other Ambulatory Visit: Payer: Self-pay | Admitting: Certified Nurse Midwife

## 2017-10-14 DIAGNOSIS — E039 Hypothyroidism, unspecified: Secondary | ICD-10-CM

## 2017-10-14 LAB — LIPID PANEL
Chol/HDL Ratio: 3.8 ratio (ref 0.0–4.4)
Cholesterol, Total: 134 mg/dL (ref 100–199)
HDL: 35 mg/dL — AB (ref >39)
LDL CALC: 77 mg/dL (ref 0–99)
Triglycerides: 108 mg/dL (ref 0–149)
VLDL CHOLESTEROL CAL: 22 mg/dL (ref 5–40)

## 2017-10-14 LAB — COMPREHENSIVE METABOLIC PANEL
A/G RATIO: 1.6 (ref 1.2–2.2)
ALT: 17 IU/L (ref 0–32)
AST: 14 IU/L (ref 0–40)
Albumin: 4.4 g/dL (ref 3.5–5.5)
Alkaline Phosphatase: 115 IU/L (ref 39–117)
BILIRUBIN TOTAL: 0.7 mg/dL (ref 0.0–1.2)
BUN/Creatinine Ratio: 9 (ref 9–23)
BUN: 10 mg/dL (ref 6–24)
CALCIUM: 9.3 mg/dL (ref 8.7–10.2)
CHLORIDE: 102 mmol/L (ref 96–106)
CO2: 24 mmol/L (ref 20–29)
Creatinine, Ser: 1.06 mg/dL — ABNORMAL HIGH (ref 0.57–1.00)
GFR, EST AFRICAN AMERICAN: 72 mL/min/{1.73_m2} (ref >59)
GFR, EST NON AFRICAN AMERICAN: 63 mL/min/{1.73_m2} (ref >59)
Globulin, Total: 2.7 g/dL (ref 1.5–4.5)
Glucose: 80 mg/dL (ref 65–99)
POTASSIUM: 4.7 mmol/L (ref 3.5–5.2)
Sodium: 141 mmol/L (ref 134–144)
Total Protein: 7.1 g/dL (ref 6.0–8.5)

## 2017-10-14 LAB — VITAMIN D 25 HYDROXY (VIT D DEFICIENCY, FRACTURES): VIT D 25 HYDROXY: 28.5 ng/mL — AB (ref 30.0–100.0)

## 2017-10-14 LAB — HEMOGLOBIN A1C
ESTIMATED AVERAGE GLUCOSE: 108 mg/dL
HEMOGLOBIN A1C: 5.4 % (ref 4.8–5.6)

## 2017-10-14 LAB — TSH: TSH: 1.43 u[IU]/mL (ref 0.450–4.500)

## 2017-10-14 MED ORDER — LEVOTHYROXINE SODIUM 75 MCG PO TABS: ORAL_TABLET | ORAL | 12 refills | Status: DC

## 2017-10-18 ENCOUNTER — Encounter: Payer: Self-pay | Admitting: Certified Nurse Midwife

## 2017-12-16 ENCOUNTER — Ambulatory Visit: Payer: BC Managed Care – PPO | Admitting: Podiatry

## 2017-12-16 DIAGNOSIS — L600 Ingrowing nail: Secondary | ICD-10-CM | POA: Diagnosis not present

## 2017-12-16 DIAGNOSIS — L603 Nail dystrophy: Secondary | ICD-10-CM | POA: Diagnosis not present

## 2017-12-16 MED ORDER — CEPHALEXIN 500 MG PO CAPS: 500.0000 mg | ORAL_CAPSULE | Freq: Three times a day (TID) | ORAL | 0 refills | Status: DC

## 2017-12-16 NOTE — Patient Instructions (Signed)

## 2017-12-18 MED ORDER — CEPHALEXIN 500 MG PO CAPS: 500.0000 mg | ORAL_CAPSULE | Freq: Three times a day (TID) | ORAL | 0 refills | Status: DC

## 2017-12-18 NOTE — Progress Notes (Signed)
Subjective: 47 year old female presents the office today for concerns of a recurrent ingrown toenail left great toe.  She said that she is doing well however on Monday she cut her toenail and afterwards started become red and painful.  She denies any drainage or pus coming from the area.  Previously discussed total nail removal at this point she wished to Proceed with That. Denies any systemic complaints such as fevers, chills, nausea, vomiting. No acute changes since last appointment, and no other complaints at this time.   Objective: AAO x3, NAD DP/PT pulses palpable bilaterally, CRT less than 3 seconds The nail has incurvation towards the distal medial aspect there is localized edema erythema to this area.  Overall the toenails dystrophic with ill-defined discoloration.  There is no drainage or pus identified at this time there is no ascending cellulitis.  There is no fluctuation or crepitation. No open lesions or pre-ulcerative lesions.  No pain with calf compression, swelling, warmth, erythema  Assessment: Left hallux ingrown toenail, onychodystrophy  Plan: -All treatment options discussed with the patient including all alternatives, risks, complications.  -At this point she was going on the entire toenail removed after we discussed.  Discussed permanent avulsion she wished to proceed. -At this time, the patient is requesting total nail removal with chemical matricectomy to the nail. Risks and complications were discussed with the patient for which they understand and written consent was obtained. Under sterile conditions a total of 3 mL of a mixture of 2% lidocaine plain and 0.5% Marcaine plain was infiltrated in a hallux block fashion. Once anesthetized, the skin was prepped in sterile fashion. A tourniquet was then applied. Next the left hallux nail was then excised making sure to remove the entire offending nail border. Once the nails were ensured to be removed area was debrided and the  underlying skin was intact. There is no purulence identified in the procedure. Next phenol was then applied under standard conditions and copiously irrigated. Silvadene was applied. A dry sterile dressing was applied. After application of the dressing the tourniquet was removed and there is found to be an immediate capillary refill time to the digit. The patient tolerated the procedure well any complications. Post procedure instructions were discussed the patient for which he verbally understood. Follow-up in one week for nail check or sooner if any problems are to arise. Discussed signs/symptoms of infection and directed to call the office immediately should any occur or go directly to the emergency room. In the meantime, encouraged to call the office with any questions, concerns, changes symptoms. -Patient encouraged to call the office with any questions, concerns, change in symptoms.  -Keflex  Vivi BarrackMatthew R Wagoner DPM

## 2017-12-20 ENCOUNTER — Ambulatory Visit: Payer: BC Managed Care – PPO | Admitting: Podiatry

## 2017-12-21 ENCOUNTER — Telehealth: Payer: Self-pay | Admitting: Podiatry

## 2017-12-21 NOTE — Telephone Encounter (Signed)
I called pt and she stated the surrounding the toenail is red, and has black at the corner, but is not very painful. I told pt that was normal to continue the soaks and the further she got from the procedure date the less of the symptoms she would have. Pt states she didn't know wha to take for the discomfort and I told her if she was able to take ibuprofen take as the OTC package instructs. Pt states she has an appt Friday.

## 2017-12-21 NOTE — Telephone Encounter (Signed)
I had my left great toenail removed and I have some questions about how its supposed to look and feel. There is some bruising and some dark red almost black areas on it. I just wanted to ask some questions about that. My number is [phone]. Thank you.

## 2017-12-23 ENCOUNTER — Ambulatory Visit (INDEPENDENT_AMBULATORY_CARE_PROVIDER_SITE_OTHER): Payer: BC Managed Care – PPO

## 2017-12-23 DIAGNOSIS — L600 Ingrowing nail: Secondary | ICD-10-CM

## 2017-12-23 NOTE — Patient Instructions (Signed)

## 2017-12-27 ENCOUNTER — Ambulatory Visit: Payer: BC Managed Care – PPO

## 2017-12-27 DIAGNOSIS — L600 Ingrowing nail: Secondary | ICD-10-CM

## 2017-12-27 NOTE — Progress Notes (Signed)
Patient is here today for follow-up appointment, procedure performed 12/16/2017, removal of left hallux ingrown toenail.  She states the area is slightly tender, but the pain has improved since the procedure.  No redness, no erythema, yellow clear drainage, with slight maceration around the nailbed edges.  Area appears to be healing well, and is free from signs and symptoms of infection.  Discussed signs and symptoms of infection.  Verbal and written instructions were dispensed, patient is to follow-up next week for reevaluation due to concerns with overall appearance of the nailbed.

## 2018-01-02 NOTE — Progress Notes (Signed)
Patient is here today for follow-up appointment, procedure performed 12/16/2017, removal of left hallux ingrown toenail.  She states the area is slightly tender, but the pain has improved since the procedure.  No redness, no erythema, yellow clear drainage, area appears to be scabbing over. Area appears to be healing well, and is free from signs and symptoms of infection.  Discussed signs and symptoms of infection.  Patient is to follow-up with any acute symptom changes.

## 2018-01-03 ENCOUNTER — Ambulatory Visit: Payer: BC Managed Care – PPO | Admitting: Certified Nurse Midwife

## 2018-01-04 ENCOUNTER — Ambulatory Visit: Payer: BC Managed Care – PPO | Admitting: Certified Nurse Midwife

## 2018-01-04 ENCOUNTER — Other Ambulatory Visit: Payer: Self-pay

## 2018-01-04 ENCOUNTER — Encounter: Payer: Self-pay | Admitting: Certified Nurse Midwife

## 2018-01-04 VITALS — BP 104/64 | HR 68 | Resp 16 | Wt 188.0 lb

## 2018-01-04 DIAGNOSIS — B379 Candidiasis, unspecified: Secondary | ICD-10-CM

## 2018-01-04 DIAGNOSIS — T3695XA Adverse effect of unspecified systemic antibiotic, initial encounter: Secondary | ICD-10-CM | POA: Diagnosis not present

## 2018-01-04 DIAGNOSIS — B373 Candidiasis of vulva and vagina: Secondary | ICD-10-CM

## 2018-01-04 DIAGNOSIS — B3731 Acute candidiasis of vulva and vagina: Secondary | ICD-10-CM

## 2018-01-04 DIAGNOSIS — N898 Other specified noninflammatory disorders of vagina: Secondary | ICD-10-CM

## 2018-01-04 MED ORDER — NYSTATIN-TRIAMCINOLONE 100000-0.1 UNIT/GM-% EX OINT: 1.0000 "application " | TOPICAL_OINTMENT | Freq: Two times a day (BID) | CUTANEOUS | 0 refills | Status: DC

## 2018-01-04 NOTE — Progress Notes (Signed)
47 y.o. Married Caucasian female [id number] here with complaint of vaginal symptoms of itching, mainly on outside on the left labia, some increase in vaginal discharge. Has white non odorous discharge. . Onset of symptoms 1-2 weeks  ago. Denies new personal products or vaginal dryness. Patient had left great toe nail removed due to infection and was given Cephalexin and feels this is the issue with vaginal itching. Urinary symptoms none noted. . Contraception is IUD working well. No other health issues today.  Review of Systems  Constitutional: Negative.   HENT: Negative.   Eyes: Negative.   Respiratory: Negative.   Cardiovascular: Negative.   Gastrointestinal: Negative.   Genitourinary:       Vaginal itching  Musculoskeletal: Negative.   Skin: Negative.   Neurological: Negative.   Endo/Heme/Allergies: Negative.   Psychiatric/Behavioral: Negative.     O:Healthy female WDWN Affect: normal, orientation x 3  Exam: Normal female appearance with scaling and exudate noted on vulva area, wet prep taken Abdomen:soft, non tender  inguinal Lymph node: no enlargement or tenderness Pelvic exam: External genital: normal female BUS: negative Vagina: scant brown/white discharge noted. Ph:   ,Wet prep taken, Affirm taken Cervix: normal, non tender, no CMT Uterus: normal, non tender Adnexa:normal, non tender, no masses or fullness noted   Wet Prep results:   A:Normal pelvic exam Yeast vulvitis R/O vaginal yeast   P:Discussed findings of yeast vulvitis and etiology. Discussed Aveeno or baking soda sitz bath for comfort with scaling and itching. Sleep without underwear if possible to help with resolving issue. Also start on oral probiotic with establishing normal GI flora after antibiotic use. Rx Mycolog ointment see order for instructions, if not resolved in 5 days, can continue for 7 days bid. Lab: Affirm will treat if indicated   Rv prn

## 2018-01-04 NOTE — Patient Instructions (Signed)

## 2018-01-05 ENCOUNTER — Other Ambulatory Visit: Payer: Self-pay

## 2018-01-05 LAB — VAGINITIS/VAGINOSIS, DNA PROBE
Candida Species: POSITIVE — AB
Gardnerella vaginalis: POSITIVE — AB
TRICHOMONAS VAG: NEGATIVE

## 2018-01-05 MED ORDER — FLUCONAZOLE 150 MG PO TABS: ORAL_TABLET | ORAL | 0 refills | Status: DC

## 2018-01-05 MED ORDER — CLINDAMYCIN PHOSPHATE 2 % VA CREA: 1.0000 | TOPICAL_CREAM | Freq: Every day | VAGINAL | 0 refills | Status: AC

## 2018-01-11 ENCOUNTER — Other Ambulatory Visit: Payer: BC Managed Care – PPO

## 2018-04-01 ENCOUNTER — Ambulatory Visit: Payer: BC Managed Care – PPO | Admitting: Podiatry

## 2018-04-01 DIAGNOSIS — L600 Ingrowing nail: Secondary | ICD-10-CM

## 2018-04-01 DIAGNOSIS — L02612 Cutaneous abscess of left foot: Secondary | ICD-10-CM

## 2018-04-01 DIAGNOSIS — L03032 Cellulitis of left toe: Secondary | ICD-10-CM

## 2018-04-01 MED ORDER — CEPHALEXIN 500 MG PO CAPS: 500.0000 mg | ORAL_CAPSULE | Freq: Three times a day (TID) | ORAL | 0 refills | Status: DC

## 2018-04-01 NOTE — Progress Notes (Signed)
Subjective: 48 year old female presents the office today for concerns of pain and swelling along the medial aspect of the left big toenail.  This started on Tuesday.  She states that she did get some new shoes and she was wearing them the day that this started but she did not feel they were rubbing.  She states this got inflamed and has been tender since then but she denies any drainage or pus.  She said no recent treatment other than try to soak her foot in Epsom salts.  She has no other concerns today.Denies any systemic complaints such as fevers, chills, nausea, vomiting. No acute changes since last appointment.  Objective: AAO x3, NAD DP/PT pulses palpable bilaterally, CRT less than 3 seconds There is localized edema and erythema to the medial aspect of the left hallux.  There is no significant toenail identified today as it has been removed previously.  However there is what seems to be a small abscess along the medial nail fold.  No ascending cellulitis. No open lesions or pre-ulcerative lesions.  No pain with calf compression, swelling, warmth, erythema  Assessment: Left hallux localized abscess, recurrent ingrown toenail  Plan: -All treatment options discussed with the patient including all alternatives, risks, complications.  -Due to what appears to be a small abscess as well as possible ingrown toenail recurrence on the medial aspect I discussed with her incision and drainage, removal of the nail corner.  She wishes to proceed today and surgical consent form was signed.  The left hallux was anesthetized after the skin was prepped with alcohol the patient with 3 cc of lidocaine and Marcaine plain.  The left toe was then prepped and a tourniquet was applied.  A small elevator to decompress the area on the medial aspect the toe and a small amount of purulence was identified from this area.  A hemostat was then also utilized to remove a small recurrent ingrown toenail to this area and a piece of  nail was removed.  At this time I did further debrided the area and there is no further purulence or abscess identified in the area.  The healthy and granular.  Because it appeared to be healthy I did apply phenol in standard fashion and irrigated with alcohol.  Betadine ointment and dry sterile dressing was applied.  Tourniquet was removed and there was finding immediate cap refill time to the toe.  She tolerated the procedure well.  Post procedure instructions discussed. -Keflex -Patient encouraged to call the office with any questions, concerns, change in symptoms.   Vivi Barrack DPM

## 2018-04-01 NOTE — Patient Instructions (Addendum)

## 2018-04-07 ENCOUNTER — Ambulatory Visit (INDEPENDENT_AMBULATORY_CARE_PROVIDER_SITE_OTHER): Payer: BC Managed Care – PPO | Admitting: Podiatry

## 2018-04-07 DIAGNOSIS — L600 Ingrowing nail: Secondary | ICD-10-CM

## 2018-04-07 NOTE — Patient Instructions (Signed)

## 2018-04-10 ENCOUNTER — Telehealth: Payer: Self-pay | Admitting: Podiatry

## 2018-04-10 NOTE — Telephone Encounter (Signed)
I'm on cephalexin that Dr. Ardelle Anton prescribed and I've broken out into hives. Please call me back at [phone].

## 2018-04-10 NOTE — Addendum Note (Signed)
Addended by: Alphia Kava D on: 04/10/2018 11:40 AM   Modules accepted: Orders

## 2018-04-10 NOTE — Telephone Encounter (Signed)
I called pt and she states she had two rounds of the benadryl and the hives aren't as bad. I told pt Dr. Ardelle Anton states she doesn't need another antibiotic since the toe appeared to be healing without incident.

## 2018-04-10 NOTE — Telephone Encounter (Signed)
Thanks. Can you ask her how the toe is doing? I think it is OK to stop all antibiotics for now unless there is redness, drainage or other signs of infection.

## 2018-04-10 NOTE — Telephone Encounter (Addendum)
I called pt, she states she has had about 9 days of the antibiotic and is covered in hives. I asked how the toe looked, pt states it looked fine, no redness or drainage. I told pt to stop the cephalexin, start benadryl as package directs, and if difficulty breathing, swelling of mouth/throat, or face go to the ED. I told pt to call with concerns.

## 2018-04-11 ENCOUNTER — Encounter: Payer: Self-pay | Admitting: Medical

## 2018-04-11 ENCOUNTER — Ambulatory Visit: Payer: BC Managed Care – PPO | Admitting: Medical

## 2018-04-11 VITALS — BP 120/76 | HR 62 | Temp 98.2°F | Resp 16 | Ht 61.0 in | Wt 191.8 lb

## 2018-04-11 DIAGNOSIS — T7840XA Allergy, unspecified, initial encounter: Secondary | ICD-10-CM

## 2018-04-11 DIAGNOSIS — L509 Urticaria, unspecified: Secondary | ICD-10-CM | POA: Diagnosis not present

## 2018-04-11 MED ORDER — PREDNISONE 10 MG PO TABS: ORAL_TABLET | ORAL | 0 refills | Status: DC

## 2018-04-11 NOTE — Patient Instructions (Addendum)
We are documenting today in your allergies that you are now allergic to Keflex/Cephalexin, causing hives  Recommendations:  Drink plenty of water this week  Continue Benadryl 25mg , either 1 tablet 3 times daily or you can do 50mg  or 2 tablets at bedtime the next few days until much improved  Begin Prednisone oral tablet taper.    Avoid hot showers this few days as this can inflame the hives  Avoid spicy foods  If not improved within the next 2-3 days, let me know  Obviously, do not take any more Cephalexin      Hives Hives are itchy, red, swollen areas on your skin. Hives can show up on any part of your body. Hives often fade within 24 hours (acute hives). New hives can show up after old ones fade. This can go on for many days or weeks (chronic hives). Hives do not spread from person to person (are not contagious). Hives are caused by your body's response to something that you are allergic to (allergen). These are sometimes called triggers. You can get hives right after being around a trigger, or hours later. What are the causes?  Allergies to foods.  Insect bites or stings.  Pollen.  Pets.  Latex.  Chemicals.  Spending time in sunlight, heat, or cold.  Exercise.  Stress.  Some medicines.  Viruses. This includes the common cold.  Infections caused by germs (bacteria).  Allergy shots.  Blood transfusions. Sometimes, the cause is not known. What increases the risk?  Being a woman.  Being allergic to foods such as: ? Citrus fruits. ? Milk. ? Eggs. ? Peanuts. ? Tree nuts. ? Shellfish.  Being allergic to: ? Medicines. ? Latex. ? Insects. ? Animals. ? Pollen. What are the signs or symptoms?   Raised, itchy, red or white bumps or patches on your skin. These areas may: ? Get large and swollen. ? Change in shape and location. ? Stand alone or connect to each other over a large area of skin. ? Sting or hurt. ? Turn white when pressed in the  center (blanch). In very bad cases, your hands, feet, and face may also get swollen. This may happen if hives start deeper in your skin. How is this treated? Treatment for this condition depends on your symptoms. Treatment may include:  Using cool, wet cloths (cool compresses) or taking cool showers to stop the itching.  Medicines that help: ? Relieve itching (antihistamines). ? Reduce swelling (corticosteroids). ? Treat infection (antibiotics).  A medicine (omalizumab) that is given as a shot (injection). Your doctor may prescribe this if you have hives that do not get better even after other treatments.  In very bad cases, you may need a shot of a medicine called epinephrineto prevent a life-threatening allergic reaction (anaphylaxis). Follow these instructions at home: Medicines  Take or apply over-the-counter and prescription medicines only as told by your doctor.  If you were prescribed an antibiotic medicine, use it as told by your doctor. Do not stop using it even if you start to feel better. Skin care  Apply cool, wet cloths to the hives.  Do not scratch your skin. Do not rub your skin. General instructions  Do not take hot showers or baths. This can make itching worse.  Do not wear tight clothes.  Use sunscreen and wear clothes that cover your skin when you are outside.  Avoid any triggers that cause your hives. Keep a journal to help track what causes your hives. Write  down: ? What medicines you take. ? What you eat and drink. ? What products you use on your skin.  Keep all follow-up visits as told by your doctor. This is important. Contact a doctor if:  Your symptoms are not better with medicine.  Your joints hurt or are swollen. Get help right away if:  You have a fever.  You have pain in your belly (abdomen).  Your tongue or lips are swollen.  Your eyelids are swollen.  Your chest or throat feels tight.  You have trouble breathing or  swallowing. These symptoms may be an emergency. Do not wait to see if the symptoms will go away. Get medical help right away. Call your local emergency services (911 in the [place] Do not drive yourself to the hospital. Summary  Hives are itchy, red, swollen areas on your skin.  Treatment for this condition depends on your symptoms.  Avoid things that cause your hives. Keep a journal to help track what causes your hives.  Take and apply over-the-counter and prescription medicines only as told by your doctor.  Keep all follow-up visits as told by your doctor. This is important. This information is not intended to replace advice given to you by your health care provider. Make sure you discuss any questions you have with your health care provider. Document Released: 12/23/2007 Document Revised: 09/28/2017 Document Reviewed: 09/28/2017 Elsevier Interactive Patient Education  2019 [employer].

## 2018-04-11 NOTE — Progress Notes (Signed)
Subjective: Chief Complaint  Patient presents with  . hives    hives all over in head also X Sunday,    Here for hives.  Started 3 days ago with a few whelps, but by yesterday awoke with hives all over.  Taking benadryl yesterday morning, and took it 4 times daily yesterday.  This didn't help all that much.  Thinks this is related to Keflex antibiotic.   She was almost finished with this.   Symptoms didn't start til after beginning Keflex.   Food doctor put her on Keflex for ingrown toenail infection.  No fever, no SOB, no body aches, but maybe some chills.  She has some mild swelling on soles of feet, but no hx/o plantar fascitis, no other recent foot issues besides the ingrown toenail.   No other aggravating or relieving factors. No other complaint.   Past Medical History:  Diagnosis Date  . Abnormal Pap smear of cervix    2015, 2016 ASCUS HPV HR neg  . Thyroid disease    hypothyroidism   Current Outpatient Medications on File Prior to Visit  Medication Sig Dispense Refill  . diphenhydrAMINE (BENADRYL) 25 MG tablet Take 25 mg by mouth every 4 (four) hours as needed.    Marland Kitchen Fexofenadine HCl (ALLEGRA PO) Take by mouth daily.    Marland Kitchen levothyroxine (SYNTHROID, LEVOTHROID) 75 MCG tablet TAKE 1 TABLET EVERY DAY 30 tablet 12  . Pseudoephedrine HCl (SUDAFED PO) Take by mouth as needed. Reported on 10/07/2015    . nystatin-triamcinolone ointment (MYCOLOG) Apply 1 application topically 2 (two) times daily. Apply BID for up to 7 days. (Patient not taking: Reported on 04/11/2018) 60 g 0   No current facility-administered medications on file prior to visit.    ROS as in subjective    Objective: BP 120/76   Pulse 62   Temp 98.2 F (36.8 C) (Oral)   Resp 16   Ht 5\' 1"  (1.549 m)   Wt 191 lb 12.8 oz (87 kg)   SpO2 98%   BMI 36.24 kg/m   Gen: wd, wn, nad Faint urticarial rash scattered throughout torso, arms, legs, neck, but somewhat faint pink.   Soles of feel with slight puffiness, but no  obvious induration, warmth, rash, and only mildly tender Lungs clear    Assessment: Encounter Diagnoses  Name Primary?  . Hives Yes  . Allergic reaction, initial encounter      Plan: discussed her findings, recent use of Keflex, and no other recent possible trigger  We are documenting today in your allergies that you are now allergic to Keflex/Cephalexin, causing hives  Recommendations:  Drink plenty of water this week  Continue Benadryl 25mg , either 1 tablet 3 times daily or you can do 50mg  or 2 tablets at bedtime the next few days until much improved  Begin Prednisone oral tablet taper.    Avoid hot showers this few days as this can inflame the hives  Avoid spicy foods  If not improved within the next 2-3 days, let me know  Obviously, do not take any more Cephalexin  Aransa was seen today for hives.  Diagnoses and all orders for this visit:  Hives  Allergic reaction, initial encounter  Other orders -     predniSONE (DELTASONE) 10 MG tablet; 6/5/4/3/2/1 taper

## 2018-04-14 ENCOUNTER — Ambulatory Visit (INDEPENDENT_AMBULATORY_CARE_PROVIDER_SITE_OTHER): Payer: BC Managed Care – PPO

## 2018-04-14 DIAGNOSIS — L600 Ingrowing nail: Secondary | ICD-10-CM

## 2018-04-17 NOTE — Progress Notes (Signed)
Patient is here today for follow-up appointment, recent procedure performed on 04/01/2018, removal of ingrown border left hallux nail.  She states that she is continue to soak and feels like the area is healing well.  No redness, no erythema, no swelling, no drainage, no other signs and symptoms of infection.  Patient does state it is slightly sore to the touch.  The area appears to be healing well and is scabbed over at this time.  Verbal and written instructions were given to the patient, she is to follow-up next week for an additional visit to check on healing.

## 2018-04-18 NOTE — Progress Notes (Signed)
Patient is here today for follow-up appointment.  Procedure performed 04/01/2018 removal of left hallux nail border.  She states that overall she thinks the area is doing much better than previously and she is having no pain at this time.  No redness, no swelling, no erythema, no drainage, no other signs and symptoms of infection.  The area is healing well at this time.  Discussed signs and symptoms of infection with patient verbal and written instructions were given.  She is to follow-up as needed with any acute symptom changes.

## 2018-10-13 ENCOUNTER — Other Ambulatory Visit: Payer: Self-pay

## 2018-10-17 ENCOUNTER — Encounter: Payer: Self-pay | Admitting: Certified Nurse Midwife

## 2018-10-17 ENCOUNTER — Ambulatory Visit: Payer: BC Managed Care – PPO | Admitting: Certified Nurse Midwife

## 2018-10-17 ENCOUNTER — Other Ambulatory Visit: Payer: Self-pay

## 2018-10-17 ENCOUNTER — Other Ambulatory Visit (HOSPITAL_COMMUNITY)
Admission: RE | Admit: 2018-10-17 | Discharge: 2018-10-17 | Disposition: A | Discharge status: Home / Self Care | Payer: BC Managed Care – PPO | Source: Ambulatory Visit | Attending: Certified Nurse Midwife | Admitting: Certified Nurse Midwife

## 2018-10-17 VITALS — BP 114/72 | HR 70 | Temp 97.1°F | Resp 16 | Ht 61.25 in | Wt 192.0 lb

## 2018-10-17 DIAGNOSIS — Z124 Encounter for screening for malignant neoplasm of cervix: Secondary | ICD-10-CM | POA: Insufficient documentation

## 2018-10-17 DIAGNOSIS — Z1211 Encounter for screening for malignant neoplasm of colon: Secondary | ICD-10-CM | POA: Diagnosis not present

## 2018-10-17 DIAGNOSIS — E038 Other specified hypothyroidism: Secondary | ICD-10-CM | POA: Diagnosis not present

## 2018-10-17 DIAGNOSIS — Z01419 Encounter for gynecological examination (general) (routine) without abnormal findings: Secondary | ICD-10-CM

## 2018-10-17 DIAGNOSIS — E559 Vitamin D deficiency, unspecified: Secondary | ICD-10-CM | POA: Diagnosis not present

## 2018-10-17 NOTE — Progress Notes (Signed)
48 y.o. G33P1001 Married  Caucasian Fe here for annual exam. Periods normal,  with Paragard IUD, but missed 1-2 over the past year.LMP longer, due missing previous period.. Aware of weight gain, feels thyroid medication may need adjusted. Denies any vasomotor symptoms. Sees urgent care if needed, no visits in the last year. Has mammogram scheduled. Daughter leaving for college soon. No other health issues today.  Patient's last menstrual period was 10/01/2018 (exact date).          Sexually active: Yes.    The current method of family planning is IUD.    Exercising: Yes.    walking Smoker:  no  Review of Systems  Constitutional: Negative.   HENT: Negative.   Eyes: Negative.   Respiratory: Negative.   Cardiovascular: Negative.   Gastrointestinal: Negative.   Genitourinary: Negative.   Musculoskeletal: Negative.   Skin: Negative.   Neurological: Negative.   Endo/Heme/Allergies: Negative.   Psychiatric/Behavioral: Negative.     Health Maintenance: Pap:  10-07-16 neg History of Abnormal Pap: yes MMG:  10-13-17 category b density birads 1:neg, scheduled for today Self Breast exams: occ Colonoscopy:  none BMD:   none TDaP:  2017 Shingles: no Pneumonia: no Hep C and HIV: Hep c neg 2017 Labs: yes   reports that she has never smoked. She has never used smokeless tobacco. She reports that she does not drink alcohol or use drugs.  Past Medical History:  Diagnosis Date  . Abnormal Pap smear of cervix    2015, 2016 ASCUS HPV HR neg  . Thyroid disease    hypothyroidism    Past Surgical History:  Procedure Laterality Date  . INTRAUTERINE DEVICE INSERTION     inserted 02/02/11, removal 02/01/21    Current Outpatient Medications  Medication Sig Dispense Refill  . diphenhydrAMINE (BENADRYL) 25 MG tablet Take 25 mg by mouth every 4 (four) hours as needed.    Marland Kitchen Fexofenadine HCl (ALLEGRA PO) Take by mouth daily.    Marland Kitchen levothyroxine (SYNTHROID, LEVOTHROID) 75 MCG tablet TAKE 1 TABLET  EVERY DAY 30 tablet 12  . Probiotic Product (PROBIOTIC PO) Take by mouth.     No current facility-administered medications for this visit.     Family History  Problem Relation Age of Onset  . Breast cancer Mother   . Heart disease Mother   . Hyperlipidemia Mother   . Heart disease Father   . Breast cancer Maternal Aunt   . Heart disease Maternal Grandmother     ROS:  Pertinent items are noted in HPI.  Otherwise, a comprehensive ROS was negative.  Exam:   BP 114/72   Pulse 70   Temp (!) 97.1 F (36.2 C) (Skin)   Resp 16   Ht 5' 1.25" (1.556 m)   Wt 192 lb (87.1 kg)   LMP 10/01/2018 (Exact Date)   BMI 35.98 kg/m  Height: 5' 1.25" (155.6 cm) Ht Readings from Last 3 Encounters:  10/17/18 5' 1.25" (1.556 m)  04/11/18 5\' 1"  (1.549 m)  10/13/17 5' 0.75" (1.543 m)    General appearance: alert, cooperative and appears stated age Head: Normocephalic, without obvious abnormality, atraumatic Neck: no adenopathy, supple, symmetrical, trachea midline and thyroid normal to inspection and palpation Lungs: clear to auscultation bilaterally Breasts: normal appearance, no masses or tenderness, No nipple retraction or dimpling, No nipple discharge or bleeding, No axillary or supraclavicular adenopathy Heart: regular rate and rhythm Abdomen: soft, non-tender; no masses,  no organomegaly Extremities: extremities normal, atraumatic, no cyanosis or edema Skin:  Skin color, texture, turgor normal. No rashes or lesions Lymph nodes: Cervical, supraclavicular, and axillary nodes normal. No abnormal inguinal nodes palpated Neurologic: Grossly normal   Pelvic: External genitalia:  no lesions              Urethra:  normal appearing urethra with no masses, tenderness or lesions              Bartholin's and Skene's: normal                 Vagina: normal appearing vagina with normal color and discharge, no lesions              Cervix: multiparous appearance, no cervical motion tenderness, no  lesions and IUD string noted in cervix              Pap taken: Yes.   Bimanual Exam:  Uterus:  normal size, contour, position, consistency, mobility, non-tender and anteverted              Adnexa: normal adnexa and no mass, fullness, tenderness               Rectovaginal: Confirms               Anus:  normal sphincter tone, no lesions  Chaperone present: yes  A:  Well Woman with normal exam  Contraception Paragard IUD due for removal in 2022  Hypothyroid medication stable  Screening labs  P:   Reviewed health and wellness pertinent to exam  Risks/benefits/warning signs with Paragard IUD and need advise.  Will check lab today for thyroid and advise and adjust medication if needed. Did not refill today  Screening labs  Pap smear: yes   counseled on breast self exam, mammography screening, feminine hygiene, adequate intake of calcium and vitamin D, diet and exercise  return annually or prn  An After Visit Summary was printed and given to the patient.

## 2018-10-18 LAB — COMPREHENSIVE METABOLIC PANEL
ALT: 18 IU/L (ref 0–32)
AST: 15 IU/L (ref 0–40)
Albumin/Globulin Ratio: 1.5 (ref 1.2–2.2)
Albumin: 4.3 g/dL (ref 3.8–4.8)
Alkaline Phosphatase: 122 IU/L — ABNORMAL HIGH (ref 39–117)
BUN/Creatinine Ratio: 11 (ref 9–23)
BUN: 11 mg/dL (ref 6–24)
Bilirubin Total: 0.7 mg/dL (ref 0.0–1.2)
CO2: 25 mmol/L (ref 20–29)
Calcium: 9.1 mg/dL (ref 8.7–10.2)
Chloride: 102 mmol/L (ref 96–106)
Creatinine, Ser: 0.96 mg/dL (ref 0.57–1.00)
GFR calc Af Amer: 81 mL/min/{1.73_m2} (ref >59)
GFR calc non Af Amer: 70 mL/min/{1.73_m2} (ref >59)
Globulin, Total: 2.8 g/dL (ref 1.5–4.5)
Glucose: 77 mg/dL (ref 65–99)
Potassium: 4.3 mmol/L (ref 3.5–5.2)
Sodium: 142 mmol/L (ref 134–144)
Total Protein: 7.1 g/dL (ref 6.0–8.5)

## 2018-10-18 LAB — TSH: TSH: 2.84 u[IU]/mL (ref 0.450–4.500)

## 2018-10-18 LAB — CBC
Hematocrit: 41 % (ref 34.0–46.6)
Hemoglobin: 13.5 g/dL (ref 11.1–15.9)
MCH: 28.5 pg (ref 26.6–33.0)
MCHC: 32.9 g/dL (ref 31.5–35.7)
MCV: 87 fL (ref 79–97)
Platelets: 284 10*3/uL (ref 150–450)
RBC: 4.74 x10E6/uL (ref 3.77–5.28)
RDW: 14.1 % (ref 11.7–15.4)
WBC: 9.5 10*3/uL (ref 3.4–10.8)

## 2018-10-18 LAB — LIPID PANEL
Chol/HDL Ratio: 3.8 ratio (ref 0.0–4.4)
Cholesterol, Total: 126 mg/dL (ref 100–199)
HDL: 33 mg/dL — ABNORMAL LOW (ref >39)
LDL Calculated: 71 mg/dL (ref 0–99)
Triglycerides: 111 mg/dL (ref 0–149)
VLDL Cholesterol Cal: 22 mg/dL (ref 5–40)

## 2018-10-18 LAB — VITAMIN D 25 HYDROXY (VIT D DEFICIENCY, FRACTURES): Vit D, 25-Hydroxy: 35.4 ng/mL (ref 30.0–100.0)

## 2018-10-19 ENCOUNTER — Other Ambulatory Visit: Payer: Self-pay | Admitting: Certified Nurse Midwife

## 2018-10-19 DIAGNOSIS — E039 Hypothyroidism, unspecified: Secondary | ICD-10-CM

## 2018-10-19 LAB — CYTOLOGY - PAP
Diagnosis: NEGATIVE
HPV: NOT DETECTED

## 2018-10-19 MED ORDER — LEVOTHYROXINE SODIUM 75 MCG PO TABS: ORAL_TABLET | ORAL | 12 refills | Status: DC

## 2019-02-26 ENCOUNTER — Telehealth: Payer: Self-pay | Admitting: Certified Nurse Midwife

## 2019-02-26 NOTE — Telephone Encounter (Signed)
Patient is requesting return call from [place]. Patient refused to give any additional information other than, "I have a question."

## 2019-02-26 NOTE — Telephone Encounter (Signed)
Patient called asking if we see patients who are 48yrs old. Patient thinks its time for her daughter to start having annual exams & to make sure no problems with mentrual cycles & pain. Patient to speak to her daughter & will call to make an appointment when they are available. Patient is aware we would be happy to see her daughter. Routing to [employer], CNM

## 2019-02-27 DIAGNOSIS — U071 COVID-19: Secondary | ICD-10-CM

## 2019-02-27 HISTORY — DX: COVID-19: U07.1

## 2019-03-02 ENCOUNTER — Encounter: Payer: Self-pay | Admitting: Family Medicine

## 2019-03-02 ENCOUNTER — Other Ambulatory Visit: Payer: Self-pay

## 2019-03-02 ENCOUNTER — Ambulatory Visit: Payer: BC Managed Care – PPO | Admitting: Family Medicine

## 2019-03-02 VITALS — Wt 189.0 lb

## 2019-03-02 DIAGNOSIS — J0140 Acute pansinusitis, unspecified: Secondary | ICD-10-CM | POA: Diagnosis not present

## 2019-03-02 MED ORDER — SULFAMETHOXAZOLE-TRIMETHOPRIM 800-160 MG PO TABS: 1.0000 | ORAL_TABLET | Freq: Two times a day (BID) | ORAL | 0 refills | Status: DC

## 2019-03-02 NOTE — Progress Notes (Signed)
   Subjective:  Documentation for virtual audio and video telecommunications through [place] encounter:  The patient was located at home. 2 patient identifiers used.  The provider was located in the office. The patient did consent to this visit and is aware of possible charges through their insurance for this visit.  The other persons participating in this telemedicine service were none.    Patient ID: Leah Estrada, female    DOB: [birth year], 48 y.o.   MRN: [id number]  HPI Chief Complaint  Patient presents with  . sinus infection    sinus infection- tuesday this week.pressure eyes, drainage, alittle cough off and on. taking cold and sinus otc.  had rapid covid yesterday was negative    Complains of a 4 day history of nasal congestion, sinus pain and pressure, post nasal congestion, mild cough. States she feels like when she gets a sinus infection.   Denies fever, chills, body aches, sore throat, chest pain, palpitations, shortness of breath, abdominal pain, N/V/D.  No loss of taste or smell.   States was exposed to someone with Covid-19 approximately 9 days ago. She had a negative rapid Covid-19 test yesterday.   She has tried aspirin, Advil cold and sinus as well as Sudafed.   Reviewed allergies, medications, past medical, surgical, family, and social history.    Review of Systems Pertinent positives and negatives in the history of present illness.     Objective:   Physical Exam Wt 189 lb (85.7 kg)   BMI 35.42 kg/m   Alert and oriented and in no acute distress. Sounds nasally congested. Sinus tenderness when she palpates.  Respirations unlabored. Normal speech, mood.       Assessment & Plan:  Acute non-recurrent pansinusitis - Plan: sulfamethoxazole-trimethoprim (BACTRIM DS) 800-160 MG tablet  No red flag symptoms. She reports having a negative Covid-19 test yesterday. Feels like this is a sinusitis and I agree that her symptoms are classic. I will treat her with  Bactrim due to allergies and she will continue with symptomatic treatment. Follow up if not back to baseline or if any new or worsening symptoms arise. She is working from home so no work note needed.   Time spent on call was 12 minutes and in review of previous records 15 minutes total.  This virtual service is not related to other E/M service within previous 7 days.

## 2019-03-05 ENCOUNTER — Telehealth: Payer: Self-pay | Admitting: Internal Medicine

## 2019-03-05 NOTE — Telephone Encounter (Signed)
Pt was notified and will await the results

## 2019-03-05 NOTE — Telephone Encounter (Signed)
Pt actually went yesterday to [facility] and got tested for covid and waiting for results. Pt states she did not ever take the antibiotic. Should she start it or just wait until results

## 2019-03-05 NOTE — Telephone Encounter (Signed)
Pt called and left voicemail that she is still dealing with sinus issues but Friday evening she started loosing her taste and smell and has continued to be stopped up. Does she need to continue antibiotic?

## 2019-03-05 NOTE — Telephone Encounter (Signed)
Ok to wait and if she is still negative and feels that she has a sinus infection, start it then.

## 2019-03-05 NOTE — Telephone Encounter (Signed)
It sounds like she should get a Covid-19 test at this point. I would continue the antibiotic for possible sinusitis but if she feels that her symptoms are no longer related to a sinus infection and she does not want to continue on the antibiotic, she may stop.

## 2019-03-05 NOTE — Telephone Encounter (Signed)
Left message for pt to call me back 

## 2019-03-09 ENCOUNTER — Other Ambulatory Visit: Payer: Self-pay

## 2019-03-14 ENCOUNTER — Encounter: Payer: Self-pay | Admitting: Family Medicine

## 2019-03-14 ENCOUNTER — Other Ambulatory Visit: Payer: Self-pay

## 2019-03-14 ENCOUNTER — Ambulatory Visit: Payer: BC Managed Care – PPO | Admitting: Family Medicine

## 2019-03-14 VITALS — Temp 97.7°F | Wt 187.0 lb

## 2019-03-14 DIAGNOSIS — Z09 Encounter for follow-up examination after completed treatment for conditions other than malignant neoplasm: Secondary | ICD-10-CM | POA: Diagnosis not present

## 2019-03-14 DIAGNOSIS — U071 COVID-19: Secondary | ICD-10-CM

## 2019-03-14 NOTE — Progress Notes (Signed)
   Subjective:  Documentation for virtual audio and video telecommunications through [place] encounter:  The patient was located at home. 2 patient identifiers used.  The provider was located in the office. The patient did consent to this visit and is aware of possible charges through their insurance for this visit.  The other persons participating in this telemedicine service were none.    Patient ID: Leah Estrada, female    DOB: 05/19/1970, 48 y.o.   MRN: [id number]  HPI Chief Complaint  Patient presents with  . follow-up    follow-up on covid.  doing better. loss of smell and taste have come back   This is a virtual follow up on recent Covid-19 illness.  I saw her virtually on 03/02/2019 for what appeared to be acute sinusitis.  Subsequently she became more symptomatic and lost her sense of smell and taste.  She was tested at [facility] on [facility]04[facility]19 and had a positive Covid-19 result.  Today she reports being back to her baseline health except for a mild intermittent cough.  Denies any fever for the past 3 days and has not been taking an antipyretic.  No new symptoms.  Sense of taste and smell have returned.  She works for [employer]. 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  Denies fever, chills, dizziness, chest pain, palpitations, shortness of breath.   Reviewed allergies, medications, past medical, surgical, family, and social history.   Review of Systems Pertinent positives and negatives in the history of present illness.     Objective:   Physical Exam Temp 97.7 F (36.5 C) (Oral)   Wt 187 lb (84.8 kg)   BMI 35.05 kg/m   Alert and oriented and in no acute distress.  Respirations unlabored.  Normal speech, mood and thought process.      Assessment & Plan:  COVID-19 virus infection  Follow up  [facility]. +Covid-19 infection on 03/04/2019.  She reports doing well and essentially back to her baseline  health. Today is day 10 post Covid-19 diagnosis and per CDC guidelines she may return to work tomorrow. We will provide her with a return to work note. She may get a Covid-19 antibody test in the next few weeks if she would like.   Time spent on call was 11 minutes and in review of previous records 15 minutes total.  This virtual service is not related to other E/M service within previous 7 days.

## 2019-03-28 ENCOUNTER — Telehealth: Payer: Self-pay | Admitting: Certified Nurse Midwife

## 2019-03-28 ENCOUNTER — Telehealth: Payer: Self-pay | Admitting: Internal Medicine

## 2019-03-28 NOTE — Telephone Encounter (Signed)
Patient states she is having symptoms of a bacteria infection. Asked to be seen this week by Ms Debbi. Informed patient she will not be back in the office until 04/02/19 and offered appointment with different provider. Patient declined, scheduled with Ms Manon Hilding 04/02/19, stating she will "tough it out" until then.

## 2019-03-28 NOTE — Progress Notes (Signed)
GYNECOLOGY  VISIT   HPI: 48 y.o.   Married  Caucasian  female   G1P1001 with Patient's last menstrual period was 01/20/2019.   here for BV. Patient complains of having irritability, "feels out of balance" in the vagina, some itching.  Off and on for the last week, she has itching and a little bit of discharge. She feels irritability in the vaginal area.  She feels twinges. No odor.  No self treatment.   She changed her detergents. Tide pods.  No menses for 2 months.  She has skipped in the spring of this year.   Positive Covid test on 03/04/19. She lost her smell and taste, and it is slowly returning.   No change of sexual partners.  Some vaginal dryness.   GYNECOLOGIC HISTORY: Patient's last menstrual period was 01/20/2019. Contraception: ParaGard IUD. Menopausal hormone therapy:  none Last mammogram: 10-17-18 Neg/density B/BiRads2--Benign intramammary nodes,benign scattered calcifications and benign vascular calcifications in both breasts. Last pap smear: 10-17-18 Neg:Neg HR HPV, 10-07-16 neg, 09-1115 Neg:Neg HR HPV        OB History    Gravida  1   Para  1   Term  1   Preterm      AB      Living  1     SAB      TAB      Ectopic      Multiple      Live Births  1              Patient Active Problem List   Diagnosis Date Noted  . Ingrown toenail 08/16/2017  . Onychomycosis 11/10/2016    Past Medical History:  Diagnosis Date  . Abnormal Pap smear of cervix    2015, 2016 ASCUS HPV HR neg  . Thyroid disease    hypothyroidism    Past Surgical History:  Procedure Laterality Date  . INTRAUTERINE DEVICE INSERTION     inserted 02/02/11, removal 02/01/21    Current Outpatient Medications  Medication Sig Dispense Refill  . Ascorbic Acid (VITAMIN C) 100 MG tablet Take 100 mg by mouth daily.    Marland Kitchen Fexofenadine HCl (ALLEGRA PO) Take by mouth daily.    Marland Kitchen levothyroxine (SYNTHROID) 75 MCG tablet TAKE 1 TABLET EVERY DAY 30 tablet 12  . Multiple  Vitamins-Minerals (ZINC PO) Take by mouth.    . Nutritional Supplements (VITAMIN D BOOSTER PO) Take by mouth.    . Probiotic Product (PROBIOTIC PO) Take by mouth. Takes 2     No current facility-administered medications for this visit.     ALLERGIES: Cephalexin and Flagyl [metronidazole]  Family History  Problem Relation Age of Onset  . Breast cancer Mother   . Heart disease Mother   . Hyperlipidemia Mother   . Heart disease Father   . Breast cancer Maternal Aunt   . Heart disease Maternal Grandmother     Social History   Socioeconomic History  . Marital status: Married    Spouse name: Not on file  . Number of children: Not on file  . Years of education: Not on file  . Highest education level: Not on file  Occupational History  . Not on file  Tobacco Use  . Smoking status: Never Smoker  . Smokeless tobacco: Never Used  Substance and Sexual Activity  . Alcohol use: No  . Drug use: No  . Sexual activity: Yes    Partners: Male    Birth control/protection: I.U.D.  Other Topics Concern  .  Not on file  Social History Narrative  . Not on file   Social Determinants of Health   Financial Resource Strain:   . Difficulty of Paying Living Expenses: Not on file  Food Insecurity:   . Worried About Programme researcher, broadcasting/film/videounning Out of Food in the Last Year: Not on file  . Ran Out of Food in the Last Year: Not on file  Transportation Needs:   . Lack of Transportation (Medical): Not on file  . Lack of Transportation (Non-Medical): Not on file  Physical Activity:   . Days of Exercise per Week: Not on file  . Minutes of Exercise per Session: Not on file  Stress:   . Feeling of Stress : Not on file  Social Connections:   . Frequency of Communication with Friends and Family: Not on file  . Frequency of Social Gatherings with Friends and Family: Not on file  . Attends Religious Services: Not on file  . Active Member of Clubs or Organizations: Not on file  . Attends BankerClub or Organization Meetings:  Not on file  . Marital Status: Not on file  Intimate Partner Violence:   . Fear of Current or Ex-Partner: Not on file  . Emotionally Abused: Not on file  . Physically Abused: Not on file  . Sexually Abused: Not on file    Review of Systems  Constitutional: Negative.   HENT: Negative.   Eyes: Negative.   Respiratory: Negative.   Cardiovascular: Negative.   Gastrointestinal: Negative.   Endocrine: Negative.   Genitourinary:       Vaginal discomfort Vaginal itching  Musculoskeletal: Negative.   Skin: Negative.   Allergic/Immunologic: Negative.   Neurological: Negative.   Hematological: Negative.   Psychiatric/Behavioral: Negative.     PHYSICAL EXAMINATION:    BP 132/70 (BP Location: Right Arm, Patient Position: Sitting, Cuff Size: Normal)   Pulse 72   Temp (!) 97 F (36.1 C) (Temporal)   Resp 12   Ht 5' 1.25" (1.556 m)   Wt 192 lb 6.4 oz (87.3 kg)   LMP 01/20/2019   BMI 36.06 kg/m     General appearance: alert, cooperative and appears stated age  Pelvic: External genitalia:  Vulvar erythema.              Urethra:  normal appearing urethra with no masses, tenderness or lesions              Bartholins and Skenes: normal                 Vagina: normal appearing vagina with normal color and discharge, no lesions              Cervix: no lesions                Bimanual Exam:  Uterus:  normal size, contour, position, consistency, mobility, non-tender              Adnexa: no mass, fullness, tenderness              Chaperone was present for exam.  Claudette LawsAmanda Dixon.  Wet prep - pH 5.0. No hyphae.  No clue cells noted.  Fairly acellular specimen.  ASSESSMENT  Vulvovaginitis. FH breast cancer.   PLAN  We discussed the different forms of vaginitis including atrophic vaginitis.  Mycolog II cream.  FU Affirm.  If she has BV, will treat with Clindamycin vaginally. We discussed treatment option for vaginal atrophy - coconut oil, vit E, and vaginal estrogens.   An  After  Visit Summary was printed and given to the patient.  __15____ minutes face to face time of which over 50% was spent in counseling.

## 2019-03-28 NOTE — Telephone Encounter (Signed)
Spoke to pt. Pt states having irritability and cramping and has some fullness x 1 week. Same sx has been off and on this year because started having irregular cycles in April and May 2020. Pt also states having no cycle for last 2 months. LMP 01/20/19. Next AEX 10/22/2019 with DL. Encouraged pt to be seen sooner due to sx and not wanting to wait until  04/02/2019 appt with DL. Pt agreeable and scheduled with Dr Quincy Simmonds on 03/29/2019 at 1030. Pt thankful for call.   Routed to Dr Quincy Simmonds for review and will close encounter.  Debbi, CNM for [facility]

## 2019-03-28 NOTE — Telephone Encounter (Signed)
Pt had covid earlier this month and recovered but was around someone christmas with no mask and over an hour and they are now positive. She wants to know what to do? Can she get covid again this soon? Does she need to quartintine?

## 2019-03-28 NOTE — Telephone Encounter (Signed)
Per shane tysinger,  They says she could develop antibiotics and don't know if she could get it again this soon, if she isn't having symptoms and protecting her self then she may be ok, but it wouldn't hurt to quarantine to be safe.   Pt was notified

## 2019-03-29 ENCOUNTER — Other Ambulatory Visit: Payer: Self-pay

## 2019-03-29 ENCOUNTER — Encounter: Payer: Self-pay | Admitting: Obstetrics and Gynecology

## 2019-03-29 ENCOUNTER — Ambulatory Visit: Payer: BC Managed Care – PPO | Admitting: Obstetrics and Gynecology

## 2019-03-29 VITALS — BP 132/70 | HR 72 | Temp 97.0°F | Resp 12 | Ht 61.25 in | Wt 192.4 lb

## 2019-03-29 DIAGNOSIS — N760 Acute vaginitis: Secondary | ICD-10-CM

## 2019-03-29 MED ORDER — NYSTATIN-TRIAMCINOLONE 100000-0.1 UNIT/GM-% EX CREA: 1.0000 "application " | TOPICAL_CREAM | Freq: Two times a day (BID) | CUTANEOUS | 0 refills | Status: DC

## 2019-03-29 NOTE — Patient Instructions (Signed)
Atrophic Vaginitis  Atrophic vaginitis is a condition in which the tissues that line the vagina become dry and thin. This condition is most common in women who have stopped having regular menstrual periods (are in menopause). This usually starts when a woman is 45-48 years old. That is the time when a woman's estrogen levels begin to drop (decrease). Estrogen is a female hormone. It helps to keep the tissues of the vagina moist. It stimulates the vagina to produce a clear fluid that lubricates the vagina for sexual intercourse. This fluid also protects the vagina from infection. Lack of estrogen can cause the lining of the vagina to get thinner and dryer. The vagina may also shrink in size. It may become less elastic. Atrophic vaginitis tends to get worse over time as a woman's estrogen level drops. What are the causes? This condition is caused by the normal drop in estrogen that happens around the time of menopause. What increases the risk? Certain conditions or situations may lower a woman's estrogen level, leading to a higher risk for atrophic vaginitis. You are more likely to develop this condition if:  You are taking medicines that block estrogen.  You have had your ovaries removed.  You are being treated for cancer with X-ray (radiation) or medicines (chemotherapy).  You have given birth or are breastfeeding.  You are older than age 50.  You smoke. What are the signs or symptoms? Symptoms of this condition include:  Pain, soreness, or bleeding during sexual intercourse (dyspareunia).  Vaginal burning, irritation, or itching.  Pain or bleeding when a speculum is used in a vaginal exam (pelvic exam).  Having burning pain when passing urine.  Vaginal discharge that is brown or yellow. In some cases, there are no symptoms. How is this diagnosed? This condition is diagnosed by taking a medical history and doing a physical exam. This will include a pelvic exam that checks the  vaginal tissues. Though rare, you may also have other tests, including:  A urine test.  A test that checks the acid balance in your vagina (acid balance test). How is this treated? Treatment for this condition depends on how severe your symptoms are. Treatment may include:  Using an over-the-counter vaginal lubricant before sex.  Using a long-acting vaginal moisturizer.  Using low-dose vaginal estrogen for moderate to severe symptoms that do not respond to other treatments. Options include creams, tablets, and inserts (vaginal rings). Before you use a vaginal estrogen, tell your health care provider if you have a history of: ? Breast cancer. ? Endometrial cancer. ? Blood clots. If you are not sexually active and your symptoms are very mild, you may not need treatment. Follow these instructions at home: Medicines  Take over-the-counter and prescription medicines only as told by your health care provider. Do not use herbal or alternative medicines unless your health care provider says that you can.  Use over-the-counter creams, lubricants, or moisturizers for dryness only as directed by your health care provider. General instructions  If your atrophic vaginitis is caused by menopause, discuss all of your menopause symptoms and treatment options with your health care provider.  Do not douche.  Do not use products that can make your vagina dry. These include: ? Scented feminine sprays. ? Scented tampons. ? Scented soaps.  Vaginal intercourse can help to improve blood flow and elasticity of vaginal tissue. If it hurts to have sex, try using a lubricant or moisturizer just before having intercourse. Contact a health care provider if:    Your discharge looks different than normal.  Your vagina has an unusual smell.  You have new symptoms.  Your symptoms do not improve with treatment.  Your symptoms get worse. Summary  Atrophic vaginitis is a condition in which the tissues that  line the vagina become dry and thin. It is most common in women who have stopped having regular menstrual periods (are in menopause).  Treatment options include using vaginal lubricants and low-dose vaginal estrogen.  Contact a health care provider if your vagina has an unusual smell, or if your symptoms get worse or do not improve after treatment. This information is not intended to replace advice given to you by your health care provider. Make sure you discuss any questions you have with your health care provider. Document Revised: 02/25/2017 Document Reviewed: 12/09/2016 Elsevier Patient Education  2020 Elsevier Inc.  

## 2019-03-30 LAB — VAGINITIS/VAGINOSIS, DNA PROBE
Candida Species: NEGATIVE
Gardnerella vaginalis: POSITIVE — AB
Trichomonas vaginosis: NEGATIVE

## 2019-04-01 MED ORDER — CLINDAMYCIN PHOSPHATE 2 % VA CREA: 1.0000 | TOPICAL_CREAM | Freq: Every day | VAGINAL | 0 refills | Status: DC

## 2019-04-01 NOTE — Addendum Note (Signed)
Addended by: Ardell Isaacs, Debbe Bales E on: 04/01/2019 07:03 PM   Modules accepted: Orders

## 2019-04-02 ENCOUNTER — Ambulatory Visit: Payer: BC Managed Care – PPO | Admitting: Certified Nurse Midwife

## 2019-04-12 ENCOUNTER — Other Ambulatory Visit: Payer: Self-pay

## 2019-04-13 ENCOUNTER — Other Ambulatory Visit: Payer: Self-pay

## 2019-04-13 ENCOUNTER — Ambulatory Visit: Payer: BC Managed Care – PPO | Admitting: Certified Nurse Midwife

## 2019-04-13 ENCOUNTER — Encounter: Payer: Self-pay | Admitting: Certified Nurse Midwife

## 2019-04-13 VITALS — BP 110/72 | HR 68 | Temp 97.3°F | Resp 16 | Wt 192.0 lb

## 2019-04-13 DIAGNOSIS — Z30431 Encounter for routine checking of intrauterine contraceptive device: Secondary | ICD-10-CM

## 2019-04-13 DIAGNOSIS — E038 Other specified hypothyroidism: Secondary | ICD-10-CM

## 2019-04-13 DIAGNOSIS — N912 Amenorrhea, unspecified: Secondary | ICD-10-CM

## 2019-04-13 DIAGNOSIS — N898 Other specified noninflammatory disorders of vagina: Secondary | ICD-10-CM | POA: Diagnosis not present

## 2019-04-13 NOTE — Progress Notes (Signed)
49 y.o. Married Caucasian female G1P1001 here with complaint of vaginal symptoms of itching, burning, and increase discharge. Patient was treated for BV  with Cleocin cream and does not feel it has resolved. Completed medication as prescribed. She also has some vaginal atrophy Describes discharge as slightly watery but no odor.Onset of symptoms 4 days ago. Denies new personal products.  No STD concerns. Urinary symptoms none . Contraception is Paragard IUD. She has not had a period since 01/24/2019. She has not noted any hot flashes or night sweats. She feels her thyroid may be changing. Taking medication as directed. Having occasional cramping today, so "maybe starting period". No other health issues today.  Review of Systems  Constitutional: Negative.   HENT: Negative.   Eyes: Negative.   Respiratory: Negative.   Cardiovascular: Negative.   Gastrointestinal: Negative.   Genitourinary: Negative.   Musculoskeletal: Negative.   Skin:       Vaginal irritation,itching  Neurological: Negative.   Endo/Heme/Allergies: Negative.   Psychiatric/Behavioral: Negative.     O:Healthy female WDWN Affect: normal, orientation x 3  Exam: Skin : warm and dry Neck: Thyroid not enlarged, no nodules palpated Abdomen:soft, non tender, no masses, LSK negative  Inguinal Lymph nodes: no enlargement or tenderness Pelvic exam: External genital: normal female, slight edema of vulva noted with increase pink bilateral, no lesions BUS: negative Vagina: atrophic appearance with slight watery white discharge noted.  Affirm taken Cervix: normal, non tender, no CMT, IUD string seen in cervix Uterus: normal, non tender Adnexa:normal, non tender, no masses or fullness noted Rectal area: no lesions or redness   A:Normal pelvic exam R/O vaginal infection Atrophic vaginitis Amenorrhea Contraception Paragard IUD Hypothyroid medication stable   P:Discussed findings of atrophic vaginitis and etiology. Discussed  Aveeno or baking soda sitz bath for comfort. Avoid moist clothes  for extended period of time.  Coconut Oil use for skin protection prior to sexual activity and can be used to external skin for protection and dryness. Discussed will treat for infection if lab is positive. Lab: Affirm Discussed normal pelvic exam and need to evaluate amenorrhea. Thyroid, pituitary and weight can affect period along with perimenopausal changes. Questions addressed. May need Provera challenge if indicated. Lab: TSH, Prolactin, FSH  Rv prn as above  Rv prn

## 2019-04-13 NOTE — Patient Instructions (Signed)

## 2019-04-14 ENCOUNTER — Other Ambulatory Visit: Payer: Self-pay | Admitting: Certified Nurse Midwife

## 2019-04-14 DIAGNOSIS — B3731 Acute candidiasis of vulva and vagina: Secondary | ICD-10-CM

## 2019-04-14 DIAGNOSIS — B373 Candidiasis of vulva and vagina: Secondary | ICD-10-CM

## 2019-04-14 LAB — VAGINITIS/VAGINOSIS, DNA PROBE
Candida Species: POSITIVE — AB
Gardnerella vaginalis: NEGATIVE
Trichomonas vaginosis: NEGATIVE

## 2019-04-14 LAB — TSH: TSH: 1.96 u[IU]/mL (ref 0.450–4.500)

## 2019-04-14 LAB — FOLLICLE STIMULATING HORMONE: FSH: 42.9 m[IU]/mL

## 2019-04-14 LAB — PROLACTIN: Prolactin: 13.7 ng/mL (ref 4.8–23.3)

## 2019-04-14 MED ORDER — FLUCONAZOLE 150 MG PO TABS: ORAL_TABLET | ORAL | 0 refills | Status: DC

## 2019-04-16 ENCOUNTER — Telehealth: Payer: Self-pay | Admitting: Certified Nurse Midwife

## 2019-04-16 MED ORDER — MEDROXYPROGESTERONE ACETATE 10 MG PO TABS: 10.0000 mg | ORAL_TABLET | Freq: Every day | ORAL | 0 refills | Status: DC

## 2019-04-16 NOTE — Telephone Encounter (Signed)
She needs Rx Provera 10 mg x 10 days. Needs to advise if no vaginal bleeding or has vaginal bleeding within the time she is taking and up two weeks after completion. Her FSH was showing perimenopausal level. She has Paragard IUD

## 2019-04-16 NOTE — Telephone Encounter (Signed)
Patient states she was to call this morning regarding prescription being called in. Unsure of name of medication. No open phone notes.

## 2019-04-16 NOTE — Telephone Encounter (Signed)
Spoke with patient. Patient f/u with 04/13/19 OV and lab result recommendations. No menses to date, patient advised to call for Provera. Confirmed pharmacy on file, advised I will review Rx with Leota Sauers, CNM and return call. Patient started diflucan as instructed, symptoms have improved, not resolved, will take 2nd dose 5 days after 1st dose as instructed by Leota Sauers, CNM. Patient agreeable.   Leota Sauers, CNM -please advise on Provera Rx.

## 2019-04-16 NOTE — Telephone Encounter (Signed)
Spoke with patient, advised per Leota Sauers, CNM. Rx to verified pharmacy. Patient verbalizes understanding and is agreeable.   Encounter closed.

## 2019-05-04 ENCOUNTER — Telehealth: Payer: Self-pay | Admitting: Certified Nurse Midwife

## 2019-05-04 NOTE — Telephone Encounter (Signed)
Call to patient, left detailed message, ok per dpr. Advised per Deborah Leonard, CNM. Return call to office if any additional questions.   Encounter closed.  

## 2019-05-04 NOTE — Telephone Encounter (Signed)
Spoke with patient. Patient completed 10 days of Provera on 04/26/19, started spotting on 1/28. Patient reports spotting has continued, describes as brown and light. Denies heavy bleeding. Per review of 04/13/19 labs, perimenopausal.   Advised patient to continue to monitor bleeding if spotting does not resolve or bleeding gets heavy, return call to office. Advised Office if no menses in 3 months. Will review with Leota Sauers, CNM and f/u if any additional recommendations. Patient agreeable.   Routing to Leota Sauers, CNM to review.

## 2019-05-04 NOTE — Telephone Encounter (Signed)
Agree with plan to call if no period in 3 months or bleeding continues

## 2019-05-04 NOTE — Telephone Encounter (Signed)
Non-Urgent Medical Question Received: Today Message Contents  Leah Estrada sent to [facility]  Phone Number: [phone]  I have finished medication Jan.28.  I started spotting on Jan.28 and continue with spotting today.

## 2019-06-18 ENCOUNTER — Encounter: Payer: Self-pay | Admitting: Certified Nurse Midwife

## 2019-08-30 ENCOUNTER — Telehealth: Payer: Self-pay | Admitting: Obstetrics and Gynecology

## 2019-08-30 NOTE — Telephone Encounter (Signed)
Patient has not had period in 3 months.

## 2019-08-30 NOTE — Telephone Encounter (Signed)
Spoke with pt. Pt calling to give update per D. Leonard,CNM.  Pt states having no cycle since Jan 2021. Pt did 10 day Provera 10 mg challenge on 04/16/19 and no cycle. Pt states only spotted on 04/26/19. Pt denies any vaginal bleeding, spotting, abd pain or cramps since Feb 2021.   Pt advised to have OV for consult and possible new labs with Dr Edward Jolly. Pt agreeable. Pt scheduled with Dr Edward Jolly on 09/13/19 at 8:30 am. Pt verbalized understanding.  Last labs on 04/13/19, shows perimenopausal.   Routing to Dr Edward Jolly for review.  Encounter closed.   Verner Chol, CNM to Leda Min, RN     05/04/19 4:57 PM Note   Agree with plan to call if no period in 3 months or bleeding continues

## 2019-09-11 NOTE — Progress Notes (Signed)
GYNECOLOGY  VISIT   HPI: 49 y.o.   Married  Caucasian  female   G1P1001 with Patient's last menstrual period was 04/30/2019 (approximate).   here for amenorrhea.  Patient took Provera 10mg  x 10 days in 03/2019 and didn't have a regular cycle with this. She states she only had some spotting within a couple of days after completing the medication.  The bleeding lasted a couple of weeks. No bleeding since 04/2019. She is having hot flashes.  No night sweats.  States she would be interested a natural treatment and not prescription medication.   Labs 04/13/19: FSH 42 Normal prolactin and TSH.   Has ParaGard IUD.   Needs refill of of Synthroid until annual exam at the end of the year.   Doing a lot of canning from the garden.   UPT - negative.   GYNECOLOGIC HISTORY: Patient's last menstrual period was 04/30/2019 (approximate). Contraception: Paragard IUD  Menopausal hormone therapy:  n/a Last mammogram: 10-17-18 3D/Neg/density B/BiRads2--There are benign intramammary nodes, benign scattered calcifications and benign vascular calcifications in both breasts Last pap smear: 10-17-18 Neg:Neg HR HPV,10-07-16 neg, 10-07-15 Neg:Neg HR HPV        OB History    Gravida  1   Para  1   Term  1   Preterm      AB      Living  1     SAB      TAB      Ectopic      Multiple      Live Births  1              Patient Active Problem List   Diagnosis Date Noted  . Ingrown toenail 08/16/2017  . Onychomycosis 11/10/2016    Past Medical History:  Diagnosis Date  . Abnormal Pap smear of cervix    2015, 2016 ASCUS HPV HR neg  . Thyroid disease    hypothyroidism    Past Surgical History:  Procedure Laterality Date  . INTRAUTERINE DEVICE INSERTION     inserted 02/02/11, removal 02/01/21    Current Outpatient Medications  Medication Sig Dispense Refill  . Fexofenadine HCl (ALLEGRA PO) Take by mouth daily.    Marland Kitchen levothyroxine (SYNTHROID) 75 MCG tablet TAKE 1 TABLET EVERY DAY 30  tablet 12  . medroxyPROGESTERone (PROVERA) 10 MG tablet Take 1 tablet (10 mg total) by mouth daily. 10 tablet 0  . Nutritional Supplements (VITAMIN D BOOSTER PO) Take by mouth.    . Probiotic Product (PROBIOTIC PO) Take by mouth. Takes 2     No current facility-administered medications for this visit.     ALLERGIES: Cephalexin and Flagyl [metronidazole]  Family History  Problem Relation Age of Onset  . Breast cancer Mother   . Heart disease Mother   . Hyperlipidemia Mother   . Pancreatic cancer Mother   . Heart disease Father   . Breast cancer Maternal Aunt   . Heart disease Maternal Grandmother     Social History   Socioeconomic History  . Marital status: Married    Spouse name: Not on file  . Number of children: Not on file  . Years of education: Not on file  . Highest education level: Not on file  Occupational History  . Not on file  Tobacco Use  . Smoking status: Never Smoker  . Smokeless tobacco: Never Used  Vaping Use  . Vaping Use: Never used  Substance and Sexual Activity  . Alcohol use: No  .  Drug use: No  . Sexual activity: Yes    Partners: Male    Birth control/protection: I.U.D.  Other Topics Concern  . Not on file  Social History Narrative  . Not on file   Social Determinants of Health   Financial Resource Strain:   . Difficulty of Paying Living Expenses:   Food Insecurity:   . Worried About Programme researcher, broadcasting/film/video in the Last Year:   . Barista in the Last Year:   Transportation Needs:   . Freight forwarder (Medical):   Marland Kitchen Lack of Transportation (Non-Medical):   Physical Activity:   . Days of Exercise per Week:   . Minutes of Exercise per Session:   Stress:   . Feeling of Stress :   Social Connections:   . Frequency of Communication with Friends and Family:   . Frequency of Social Gatherings with Friends and Family:   . Attends Religious Services:   . Active Member of Clubs or Organizations:   . Attends Banker  Meetings:   Marland Kitchen Marital Status:   Intimate Partner Violence:   . Fear of Current or Ex-Partner:   . Emotionally Abused:   Marland Kitchen Physically Abused:   . Sexually Abused:     Review of Systems  All other systems reviewed and are negative.   PHYSICAL EXAMINATION:    BP 130/84 (Cuff Size: Large)   Pulse 64   Temp (!) 96.1 F (35.6 C) (Temporal)   Ht 5' 1.25" (1.556 m)   Wt 197 lb 9.6 oz (89.6 kg)   LMP 04/30/2019 (Approximate)   BMI 37.03 kg/m     General appearance: alert, cooperative and appears stated age  Pelvic: External genitalia:  no lesions              Urethra:  normal appearing urethra with no masses, tenderness or lesions              Bartholins and Skenes: normal                 Vagina: normal appearing vagina with normal color and discharge, no lesions              Cervix: no lesions.  IUD strings noted.                 Bimanual Exam:  Uterus:  normal size, contour, position, consistency, mobility, non-tender              Adnexa: no mass, fullness, tenderness                Chaperone was present for exam.  ASSESSMENT  Amenorrhea.  Menopausal symptoms.  Hypothyroidism on Synthroid.  ParaGard IUD. FH breast cancer.  PLAN  Refill of Synthroid until annual exam this fall.  Check FSH and estradiol. Course of provera 10 mg x 10 days if not menopausal. We discussed natural herbal options for treatment - vit E, coconut oil, Estroven.  Discussion of menopause and brochure given.  Keep on schedule with mammogram.   An After Visit Summary was printed and given to the patient.  28 minute visit.

## 2019-09-12 ENCOUNTER — Other Ambulatory Visit: Payer: Self-pay

## 2019-09-13 ENCOUNTER — Encounter: Payer: Self-pay | Admitting: Obstetrics and Gynecology

## 2019-09-13 ENCOUNTER — Ambulatory Visit: Payer: BC Managed Care – PPO | Admitting: Obstetrics and Gynecology

## 2019-09-13 VITALS — BP 130/84 | HR 64 | Temp 96.1°F | Ht 61.25 in | Wt 197.6 lb

## 2019-09-13 DIAGNOSIS — N926 Irregular menstruation, unspecified: Secondary | ICD-10-CM

## 2019-09-13 DIAGNOSIS — E039 Hypothyroidism, unspecified: Secondary | ICD-10-CM | POA: Diagnosis not present

## 2019-09-13 DIAGNOSIS — N951 Menopausal and female climacteric states: Secondary | ICD-10-CM | POA: Diagnosis not present

## 2019-09-13 LAB — POCT URINE PREGNANCY: Preg Test, Ur: NEGATIVE

## 2019-09-13 MED ORDER — LEVOTHYROXINE SODIUM 75 MCG PO TABS: ORAL_TABLET | ORAL | 1 refills | Status: DC

## 2019-09-13 NOTE — Patient Instructions (Addendum)
Try vitamin E vaginal suppositories and Estroven.

## 2019-09-14 LAB — FOLLICLE STIMULATING HORMONE: FSH: 39 m[IU]/mL

## 2019-09-14 LAB — ESTRADIOL: Estradiol: 36.5 pg/mL

## 2019-09-17 ENCOUNTER — Other Ambulatory Visit: Payer: Self-pay | Admitting: *Deleted

## 2019-09-17 MED ORDER — MEDROXYPROGESTERONE ACETATE 10 MG PO TABS
10.0000 mg | ORAL_TABLET | Freq: Every day | ORAL | 0 refills | Status: DC
Start: 2019-09-17 — End: 2020-01-15

## 2019-09-27 ENCOUNTER — Ambulatory Visit (INDEPENDENT_AMBULATORY_CARE_PROVIDER_SITE_OTHER): Payer: BC Managed Care – PPO

## 2019-09-27 ENCOUNTER — Encounter: Payer: Self-pay | Admitting: Podiatry

## 2019-09-27 ENCOUNTER — Ambulatory Visit: Payer: BC Managed Care – PPO | Admitting: Podiatry

## 2019-09-27 ENCOUNTER — Other Ambulatory Visit: Payer: Self-pay

## 2019-09-27 DIAGNOSIS — G8929 Other chronic pain: Secondary | ICD-10-CM | POA: Diagnosis not present

## 2019-09-27 DIAGNOSIS — M722 Plantar fascial fibromatosis: Secondary | ICD-10-CM

## 2019-09-27 DIAGNOSIS — M79671 Pain in right foot: Secondary | ICD-10-CM

## 2019-09-27 DIAGNOSIS — M5431 Sciatica, right side: Secondary | ICD-10-CM | POA: Diagnosis not present

## 2019-09-27 MED ORDER — METHYLPREDNISOLONE 4 MG PO TBPK: ORAL_TABLET | ORAL | 0 refills | Status: DC

## 2019-09-27 NOTE — Patient Instructions (Signed)

## 2019-10-07 NOTE — Progress Notes (Signed)
Subjective: 49 year old female presents the office today for concerns of right heel pain which been ongoing last 6 months.  She states that she has pain in the bottom of her heels particularly if she sits for some time she stands back up. Her to the bottom of the heel.  She also does describe some sharp pain in the anterior hip and she is not sure if this is coming from the heel or possible sciatica. Denies any systemic complaints such as fevers, chills, nausea, vomiting. No acute changes since last appointment, and no other complaints at this time.   Objective: AAO x3, NAD DP/PT pulses palpable bilaterally, CRT less than 3 seconds Tenderness to palpation along the plantar medial tubercle of the calcaneus at the insertion of plantar fascia on the right foot. There is no pain along the course of the plantar fascia within the arch of the foot. Plantar fascia appears to be intact. There is no pain with lateral compression of the calcaneus or pain with vibratory sensation. There is no pain along the course or insertion of the achilles tendon. No other areas of tenderness to bilateral lower extremities. Negative tinel sign. No pain with calf compression, swelling, warmth, erythema  Assessment: 49 year old female with right heel pain, plantar fasciitis; likely sciatica  Plan: -All treatment options discussed with the patient including all alternatives, risks, complications.  -X-rays obtained reviewed.  No evidence of acute fracture. -Steroid injection performed to the right heel.  See procedure note below. -Plantar fascial brace dispensed -Medrol dose pack -Stretching/icing daily -Shoe modifications/orthotics -Symptoms with sciatica, nerve issues continue recommend follow-up with primary care physician. -Patient encouraged to call the office with any questions, concerns, change in symptoms.   Procedure: Injection Tendon/Ligament Discussed alternatives, risks, complications and verbal consent was  obtained.  Location: RIGHT plantar fascia at the glabrous junction; medial approach. Skin Prep: Alcohol  Injectate: 0.5cc 0.5% marcaine plain, 0.5 cc 2% lidocaine plain and, 1 cc kenalog 10. Disposition: Patient tolerated procedure well. Injection site dressed with a band-aid.  Post-injection care was discussed and return precautions discussed.   No follow-ups on file.  Vivi Barrack DPM

## 2019-10-22 ENCOUNTER — Ambulatory Visit: Payer: BC Managed Care – PPO | Admitting: Certified Nurse Midwife

## 2019-11-01 ENCOUNTER — Encounter: Payer: Self-pay | Admitting: Obstetrics and Gynecology

## 2019-11-08 ENCOUNTER — Ambulatory Visit: Payer: BC Managed Care – PPO | Admitting: Podiatry

## 2019-11-15 ENCOUNTER — Ambulatory Visit: Payer: BC Managed Care – PPO | Admitting: Podiatry

## 2020-01-15 ENCOUNTER — Encounter: Payer: Self-pay | Admitting: Obstetrics and Gynecology

## 2020-01-15 ENCOUNTER — Other Ambulatory Visit: Payer: Self-pay

## 2020-01-15 ENCOUNTER — Ambulatory Visit: Payer: BC Managed Care – PPO | Admitting: Obstetrics and Gynecology

## 2020-01-15 ENCOUNTER — Telehealth: Payer: Self-pay

## 2020-01-15 VITALS — BP 122/82 | HR 78 | Ht 61.25 in | Wt 194.0 lb

## 2020-01-15 DIAGNOSIS — N760 Acute vaginitis: Secondary | ICD-10-CM

## 2020-01-15 DIAGNOSIS — Z803 Family history of malignant neoplasm of breast: Secondary | ICD-10-CM

## 2020-01-15 DIAGNOSIS — N951 Menopausal and female climacteric states: Secondary | ICD-10-CM | POA: Diagnosis not present

## 2020-01-15 MED ORDER — MEDROXYPROGESTERONE ACETATE 10 MG PO TABS: 10.0000 mg | ORAL_TABLET | Freq: Every day | ORAL | 0 refills | Status: DC

## 2020-01-15 NOTE — Telephone Encounter (Signed)
New message   Last seen  6.17.21  The patient C/o bacterial infection wanted to know if the MD have any opening this afternoon.

## 2020-01-15 NOTE — Telephone Encounter (Signed)
Spoke with patient. Patient reports vaginal irritation, discomfort and vaginal odor for 1wk. Patient reports she is perimenopausal, no menses in 3 months, would also like to discuss repeating provera. Denies any other GYN symptoms. Requesting OV. OV scheduled for today at 3pm with Dr. Edward Jolly. Patient verbalizes understanding and is agreeable.   Encounter closed.

## 2020-01-15 NOTE — Progress Notes (Signed)
GYNECOLOGY  VISIT   HPI: 49 y.o.   Married  Caucasian  female   G1P1001 with Patient's last menstrual period was 09/27/2019 (approximate).   here for vaginal irritation without odor. No discharge.  Patient took Provera in June and had cycle in July but no bleeding since.  Having breast tenderness.  Took Provera in January and she did not have a cycle.  Bleeding 04/30/19.   FSH 39 and estradiol 36.5 on 09/13/19.  She is having hot flashes, and they are manageable.  She is taking over the counter black cohosh with other herbs in it.   She has been using coconut oil for dryness with intercourse.   Had Covid in December, 2020. States her sense of smell has not returned completely to normal.  Has not had vaccination.   GYNECOLOGIC HISTORY: Patient's last menstrual period was 09/27/2019 (approximate). Contraception: Paragard IUD 02-02-11 Menopausal hormone therapy:  none Last mammogram: 10-18-19 3D/Neg/density A/BiRads1 Last pap smear:  10-17-18 Neg:Neg HR HPV,10-07-16 neg, 10-07-15 Neg:Neg HR HPV        OB History    Gravida  1   Para  1   Term  1   Preterm      AB      Living  1     SAB      TAB      Ectopic      Multiple      Live Births  1              Patient Active Problem List   Diagnosis Date Noted   Ingrown toenail 08/16/2017   Onychomycosis 11/10/2016    Past Medical History:  Diagnosis Date   Abnormal Pap smear of cervix    2015, 2016 ASCUS HPV HR neg   COVID-19 virus infection 02/2019   Thyroid disease    hypothyroidism    Past Surgical History:  Procedure Laterality Date   INTRAUTERINE DEVICE INSERTION     inserted 02/02/11, removal 02/01/21    Current Outpatient Medications  Medication Sig Dispense Refill   Ascorbic Acid (VITAMIN C) 1000 MG tablet Take 1,000 mg by mouth daily.     Fexofenadine HCl (ALLEGRA PO) Take by mouth daily.     levothyroxine (SYNTHROID) 75 MCG tablet TAKE 1 TABLET EVERY DAY 90 tablet 1   Nutritional  Supplements (VITAMIN D BOOSTER PO) Take by mouth.     Probiotic Product (PROBIOTIC PO) Take by mouth. Takes 2     medroxyPROGESTERone (PROVERA) 10 MG tablet Take 1 tablet (10 mg total) by mouth daily. 10 tablet 0   No current facility-administered medications for this visit.     ALLERGIES: Cephalexin and Flagyl [metronidazole]  Family History  Problem Relation Age of Onset   Breast cancer Mother    Heart disease Mother    Hyperlipidemia Mother    Pancreatic cancer Mother    Cancer Mother        pancreatic   Heart disease Father    Breast cancer Maternal Aunt    Heart disease Maternal Grandmother    Breast cancer Maternal Aunt     Social History   Socioeconomic History   Marital status: Married    Spouse name: Not on file   Number of children: Not on file   Years of education: Not on file   Highest education level: Not on file  Occupational History   Not on file  Tobacco Use   Smoking status: Never Smoker   Smokeless tobacco:  Never Used  Vaping Use   Vaping Use: Never used  Substance and Sexual Activity   Alcohol use: No   Drug use: No   Sexual activity: Yes    Partners: Male    Birth control/protection: I.U.D.  Other Topics Concern   Not on file  Social History Narrative   Not on file   Social Determinants of Health   Financial Resource Strain:    Difficulty of Paying Living Expenses: Not on file  Food Insecurity:    Worried About Running Out of Food in the Last Year: Not on file   Ran Out of Food in the Last Year: Not on file  Transportation Needs:    Lack of Transportation (Medical): Not on file   Lack of Transportation (Non-Medical): Not on file  Physical Activity:    Days of Exercise per Week: Not on file   Minutes of Exercise per Session: Not on file  Stress:    Feeling of Stress : Not on file  Social Connections:    Frequency of Communication with Friends and Family: Not on file   Frequency of Social Gatherings  with Friends and Family: Not on file   Attends Religious Services: Not on file   Active Member of Clubs or Organizations: Not on file   Attends Banker Meetings: Not on file   Marital Status: Not on file  Intimate Partner Violence:    Fear of Current or Ex-Partner: Not on file   Emotionally Abused: Not on file   Physically Abused: Not on file   Sexually Abused: Not on file    Review of Systems  All other systems reviewed and are negative.   PHYSICAL EXAMINATION:    BP 122/82    Pulse 78    Ht 5' 1.25" (1.556 m)    Wt 194 lb (88 kg)    LMP 09/27/2019 (Approximate)    SpO2 98%    BMI 36.36 kg/m     General appearance: alert, cooperative and appears stated age   Pelvic: External genitalia:  no lesions              Urethra:  normal appearing urethra with no masses, tenderness or lesions              Bartholins and Skenes: normal                 Vagina: normal appearing vagina with normal color and discharge, no lesions              Cervix: no lesions.  IUD strings noted.                 Bimanual Exam:  Uterus:  normal size, contour, position, consistency, mobility, non-tender              Adnexa: no mass, fullness, tenderness           Chaperone was present for exam.  ASSESSMENT  Vaginitis.  Flagyl allergy. Menopausal symptoms.  Perimenopausal by menstrual pattern. FH breast cancer in mother and 2 maternal aunts.   PLAN  We discussed forms of vaginitis including atrophic vaginitis. Affirm.  If has BV, will treat with Clindamycin ovules for 7 nights.  Declines vaginal estrogen. Vit E vaginal suppositories for vaginal hydration.  Add soy products to herbal regimen for hot flashes. Declines genetic counseling and testing. Course of Provera 10 mg x 10 days if no cycle by January 28, 2020.  FU prn.

## 2020-01-16 LAB — VAGINITIS/VAGINOSIS, DNA PROBE
Candida Species: NEGATIVE
Gardnerella vaginalis: NEGATIVE
Trichomonas vaginosis: NEGATIVE

## 2020-02-26 ENCOUNTER — Ambulatory Visit: Payer: BC Managed Care – PPO | Admitting: Obstetrics and Gynecology

## 2020-02-26 ENCOUNTER — Encounter: Payer: Self-pay | Admitting: Obstetrics and Gynecology

## 2020-02-26 ENCOUNTER — Other Ambulatory Visit: Payer: Self-pay

## 2020-02-26 VITALS — BP 124/76 | HR 78 | Ht 60.5 in | Wt 192.0 lb

## 2020-02-26 DIAGNOSIS — Z01419 Encounter for gynecological examination (general) (routine) without abnormal findings: Secondary | ICD-10-CM | POA: Diagnosis not present

## 2020-02-26 DIAGNOSIS — E039 Hypothyroidism, unspecified: Secondary | ICD-10-CM | POA: Diagnosis not present

## 2020-02-26 MED ORDER — LEVOTHYROXINE SODIUM 75 MCG PO TABS: ORAL_TABLET | ORAL | 3 refills | Status: DC

## 2020-02-26 MED ORDER — MEDROXYPROGESTERONE ACETATE 10 MG PO TABS: 10.0000 mg | ORAL_TABLET | Freq: Every day | ORAL | 1 refills | Status: DC

## 2020-02-26 NOTE — Patient Instructions (Signed)
EXERCISE AND DIET:  We recommended that you start or continue a regular exercise program for good health. Regular exercise means any activity that makes your heart beat faster and makes you sweat.  We recommend exercising at least 30 minutes per day at least 3 days a week, preferably 4 or 5.  We also recommend a diet low in fat and sugar.  Inactivity, poor dietary choices and obesity can cause diabetes, heart attack, stroke, and kidney damage, among others.   ° °ALCOHOL AND SMOKING:  Women should limit their alcohol intake to no more than 7 drinks/beers/glasses of wine (combined, not each!) per week. Moderation of alcohol intake to this level decreases your risk of breast cancer and liver damage. And of course, no recreational drugs are part of a healthy lifestyle.  And absolutely no smoking or even second hand smoke. Most people know smoking can cause heart and lung diseases, but did you know it also contributes to weakening of your bones? Aging of your skin?  Yellowing of your teeth and nails? ° °CALCIUM AND VITAMIN D:  Adequate intake of calcium and Vitamin D are recommended.  The recommendations for exact amounts of these supplements seem to change often, but generally speaking 600 mg of calcium (either carbonate or citrate) and 800 units of Vitamin D per day seems prudent. Certain women may benefit from higher intake of Vitamin D.  If you are among these women, your doctor will have told you during your visit.   ° °PAP SMEARS:  Pap smears, to check for cervical cancer or precancers,  have traditionally been done yearly, although recent scientific advances have shown that most women can have pap smears less often.  However, every woman still should have a physical exam from her gynecologist every year. It will include a breast check, inspection of the vulva and vagina to check for abnormal growths or skin changes, a visual exam of the cervix, and then an exam to evaluate the size and shape of the uterus and  ovaries.  And after 49 years of age, a rectal exam is indicated to check for rectal cancers. We will also provide age appropriate advice regarding health maintenance, like when you should have certain vaccines, screening for sexually transmitted diseases, bone density testing, colonoscopy, mammograms, etc.  ° °MAMMOGRAMS:  All women over 40 years old should have a yearly mammogram. Many facilities now offer a "3D" mammogram, which may cost around $50 extra out of pocket. If possible,  we recommend you accept the option to have the 3D mammogram performed.  It both reduces the number of women who will be called back for extra views which then turn out to be normal, and it is better than the routine mammogram at detecting truly abnormal areas.   ° °COLONOSCOPY:  Colonoscopy to screen for colon cancer is recommended for all women at age 50.  We know, you hate the idea of the prep.  We agree, BUT, having colon cancer and not knowing it is worse!!  Colon cancer so often starts as a polyp that can be seen and removed at colonscopy, which can quite literally save your life!  And if your first colonoscopy is normal and you have no family history of colon cancer, most women don't have to have it again for 10 years.  Once every ten years, you can do something that may end up saving your life, right?  We will be happy to help you get it scheduled when you are ready.    Be sure to check your insurance coverage so you understand how much it will cost.  It may be covered as a preventative service at no cost, but you should check your particular policy.     Calcium; Vitamin D oral tablets What is this medicine? CALCIUM; VITAMIN D (KAL see um; VYE ta min D) is a vitamin supplement. It is used to prevent conditions of low calcium and vitamin D. This medicine may be used for other purposes; ask your health care provider or pharmacist if you have questions. COMMON BRAND NAME(S): Calcarb 600 with Vitamin D, Calcet Plus Vitamin D,  Calcitrate + D, Calcium Citrate + D3 Maximum, Calcium Citrate Maximum with D, Caltrate, Caltrate 600+D, Citracal + D, Citracal MAXIMUM + D, Citracal Petites with Vitamin D, Citrus Calcium Plus D, OSCAL 500 + D, OSCAL Calcium + D3, OSCAL Extra D3, Osteo-Poretical, Oysco 500 + D, Oysco D, Oystercal-D Calcium What should I tell my health care provider before I take this medicine? They need to know if you have any of these conditions:  constipation  dehydration  heart disease  high level of calcium or vitamin D in the blood  high level of phosphate in the blood  kidney disease  kidney stones  liver disease  parathyroid disease  sarcoidosis  stomach ulcer or obstruction  an unusual or allergic reaction to calcium, vitamin D, tartrazine dye, other medicines, foods, dyes, or preservatives  pregnant or trying to get pregnant  breast-feeding How should I use this medicine? Take this medicine by mouth with a glass of water. Follow the directions on the label. Take with food or within 1 hour after a meal. Take your medicine at regular intervals. Do not take your medicine more often than directed. Talk to your pediatrician regarding the use of this medicine in children. While this medicine may be used in children for selected conditions, precautions do apply. Overdosage: If you think you have taken too much of this medicine contact a poison control center or emergency room at once. NOTE: This medicine is only for you. Do not share this medicine with others. What if I miss a dose? If you miss a dose, take it as soon as you can. If it is almost time for your next dose, take only that dose. Do not take double or extra doses. What may interact with this medicine? Do not take this medicine with any of the following medications:  ammonium chloride  methenamine This medicine may also interact with the following medications:  antibiotics like ciprofloxacin, gatifloxacin,  tetracycline  captopril  delavirdine  diuretics  gabapentin  iron supplements  medicines for fungal infections like ketoconazole and itraconazole  medicines for seizures like ethotoin and phenytoin  mineral oil  mycophenolate  other vitamins with calcium, vitamin D, or minerals  quinidine  rosuvastatin  sucralfate  thyroid medicine This list may not describe all possible interactions. Give your health care provider a list of all the medicines, herbs, non-prescription drugs, or dietary supplements you use. Also tell them if you smoke, drink alcohol, or use illegal drugs. Some items may interact with your medicine. What should I watch for while using this medicine? Taking this medicine is not a substitute for a well-balanced diet and exercise. Talk with your doctor or health care provider and follow a healthy lifestyle. Do not take this medicine with high-fiber foods, large amounts of alcohol, or drinks containing caffeine. Do not take this medicine within 2 hours of any other medicines. What side  effects may I notice from receiving this medicine? Side effects that you should report to your doctor or health care professional as soon as possible:  allergic reactions like skin rash, itching or hives, swelling of the face, lips, or tongue  confusion  dry mouth  high blood pressure  increased hunger or thirst  increased urination  irregular heartbeat  metallic taste  muscle or bone pain  pain when urinating  seizure  unusually weak or tired  weight loss Side effects that usually do not require medical attention (report to your doctor or health care professional if they continue or are bothersome):  constipation  diarrhea  headache  loss of appetite  nausea, vomiting  stomach upset This list may not describe all possible side effects. Call your doctor for medical advice about side effects. You may report side effects to FDA at 1-800-FDA-1088. Where  should I keep my medicine? Keep out of the reach of children. Store at room temperature between 15 and 30 degrees C (59 and 86 degrees F). Protect from light. Keep container tightly closed. Throw away any unused medicine after the expiration date. NOTE: This sheet is a summary. It may not cover all possible information. If you have questions about this medicine, talk to your doctor, pharmacist, or health care provider.  2020 Elsevier/Gold Standard (2007-06-28 17:56:23)

## 2020-02-26 NOTE — Progress Notes (Signed)
49 y.o. G4P1001 Married Caucasian female here for annual exam.    Took Provera the first of November, and had a menstruation 01/29/20.  Took Provera in June, and had menses in July, 2021.  Prior LMP was Feb. 2021.  Denies hot flashes.   No Covid vaccine.  PCP:  Hetty Blend, NP-C  Patient's last menstrual period was 01/29/2020 (approximate). with Provera. Period Pattern: (!) Irregular     Sexually active: Yes.    The current method of family planning is  IUD--Paragard 02/02/11.    Exercising: No.  The patient does not participate in regular exercise at present. Smoker:  no  Health Maintenance: Pap: 10-17-18 Neg:Neg HR HPV,10-07-16 neg, 10-07-15 Neg:Neg HR HPV History of abnormal Pap:  Yes, 2015 and 2016 ASCUS:Neg HR HPV. 10-17-14 colpo revealed neg.cervical biopsy, ECC showed severe acute and chronic cervicitis.  MMG: 10-18-19 3D/Neg/density A/BiRads1 Colonoscopy: NEVER-she would like a referral for spring BMD: N/A  Result  N/A TDaP: 10-07-15 Gardasil:   no HIV: Neg in preg Hep C: 2017 Neg Screening Labs:  Today.    reports that she has never smoked. She has never used smokeless tobacco. She reports that she does not drink alcohol and does not use drugs.  Past Medical History:  Diagnosis Date  . Abnormal Pap smear of cervix    2015, 2016 ASCUS HPV HR neg  . COVID-19 virus infection 02/2019  . Thyroid disease    hypothyroidism    Past Surgical History:  Procedure Laterality Date  . INTRAUTERINE DEVICE INSERTION     inserted 02/02/11, removal 02/01/21    Current Outpatient Medications  Medication Sig Dispense Refill  . Ascorbic Acid (VITAMIN C) 1000 MG tablet Take 1,000 mg by mouth daily.    . Cholecalciferol (VITAMIN D3) 50 MCG (2000 UT) TABS Take 1 tablet by mouth daily.    Marland Kitchen Fexofenadine HCl (ALLEGRA PO) Take by mouth daily.    Marland Kitchen levothyroxine (SYNTHROID) 75 MCG tablet TAKE 1 TABLET EVERY DAY 90 tablet 1  . medroxyPROGESTERone (PROVERA) 10 MG tablet Take 1 tablet (10 mg  total) by mouth daily. 10 tablet 0  . Probiotic Product (PROBIOTIC PO) Take by mouth. Takes 2     No current facility-administered medications for this visit.    Family History  Problem Relation Age of Onset  . Breast cancer Mother   . Heart disease Mother   . Hyperlipidemia Mother   . Pancreatic cancer Mother   . Cancer Mother        pancreatic  . Heart disease Father   . Breast cancer Maternal Aunt   . Heart disease Maternal Grandmother   . Breast cancer Maternal Aunt     Review of Systems  All other systems reviewed and are negative.   Exam:   BP 124/76 (Cuff Size: Large)   Pulse 78   Ht 5' 0.5" (1.537 m)   Wt 192 lb (87.1 kg)   LMP 01/29/2020 (Approximate)   SpO2 100%   BMI 36.88 kg/m     General appearance: alert, cooperative and appears stated age Head: normocephalic, without obvious abnormality, atraumatic Neck: no adenopathy, supple, symmetrical, trachea midline and thyroid normal to inspection and palpation Lungs: clear to auscultation bilaterally Breasts: normal appearance, no masses or tenderness, No nipple retraction or dimpling, No nipple discharge or bleeding, No axillary adenopathy Heart: regular rate and rhythm Abdomen: soft, non-tender; no masses, no organomegaly Extremities: extremities normal, atraumatic, no cyanosis or edema Skin: skin color, texture, turgor normal. No rashes  or lesions Lymph nodes: cervical, supraclavicular, and axillary nodes normal. Neurologic: grossly normal  Pelvic: External genitalia:  no lesions              No abnormal inguinal nodes palpated.              Urethra:  normal appearing urethra with no masses, tenderness or lesions              Bartholins and Skenes: normal                 Vagina: normal appearing vagina with normal color and discharge, no lesions              Cervix: no lesions.  IUD strings noted.               Pap taken: No. Bimanual Exam:  Uterus:  normal size, contour, position, consistency, mobility,  non-tender              Adnexa: no mass, fullness, tenderness              Rectal exam: Yes.  .  Confirms.              Anus:  normal sphincter tone, no lesions  Chaperone was present for exam.  Assessment:   Well woman visit with normal exam. Paragard IUD.  Due for removal in 2022.  Hypothyroidism.  On Synthroid.  FH breast cancer in mother and 2 maternal aunts.   Has declined genetic counseling/testing.  Plan: Mammogram screening discussed. Self breast awareness reviewed. Pap and HR HPV as above. Guidelines for Calcium, Vitamin D, regular exercise program including cardiovascular and weight bearing exercise. Routine labs.  Provera 10 mg x 10 days every 3 months if no spontaneous menses. We did a literature search in UP to Date about prolonged use of ParaGard IUD for up to 12 years.  We will re-address this next year.  Follow up annually and prn.

## 2020-02-27 LAB — COMPREHENSIVE METABOLIC PANEL
ALT: 26 IU/L (ref 0–32)
AST: 20 IU/L (ref 0–40)
Albumin/Globulin Ratio: 1.9 (ref 1.2–2.2)
Albumin: 4.4 g/dL (ref 3.8–4.8)
Alkaline Phosphatase: 132 IU/L — ABNORMAL HIGH (ref 44–121)
BUN/Creatinine Ratio: 9 (ref 9–23)
BUN: 9 mg/dL (ref 6–24)
Bilirubin Total: 0.4 mg/dL (ref 0.0–1.2)
CO2: 26 mmol/L (ref 20–29)
Calcium: 9.2 mg/dL (ref 8.7–10.2)
Chloride: 102 mmol/L (ref 96–106)
Creatinine, Ser: 1.03 mg/dL — ABNORMAL HIGH (ref 0.57–1.00)
GFR calc Af Amer: 74 mL/min/{1.73_m2} (ref >59)
GFR calc non Af Amer: 64 mL/min/{1.73_m2} (ref >59)
Globulin, Total: 2.3 g/dL (ref 1.5–4.5)
Glucose: 108 mg/dL — ABNORMAL HIGH (ref 65–99)
Potassium: 3.8 mmol/L (ref 3.5–5.2)
Sodium: 144 mmol/L (ref 134–144)
Total Protein: 6.7 g/dL (ref 6.0–8.5)

## 2020-02-27 LAB — LIPID PANEL
Chol/HDL Ratio: 4.8 ratio — ABNORMAL HIGH (ref 0.0–4.4)
Cholesterol, Total: 152 mg/dL (ref 100–199)
HDL: 32 mg/dL — ABNORMAL LOW (ref >39)
LDL Chol Calc (NIH): 92 mg/dL (ref 0–99)
Triglycerides: 162 mg/dL — ABNORMAL HIGH (ref 0–149)
VLDL Cholesterol Cal: 28 mg/dL (ref 5–40)

## 2020-02-27 LAB — CBC
Hematocrit: 41.5 % (ref 34.0–46.6)
Hemoglobin: 13.9 g/dL (ref 11.1–15.9)
MCH: 29.1 pg (ref 26.6–33.0)
MCHC: 33.5 g/dL (ref 31.5–35.7)
MCV: 87 fL (ref 79–97)
Platelets: 254 10*3/uL (ref 150–450)
RBC: 4.78 x10E6/uL (ref 3.77–5.28)
RDW: 13.2 % (ref 11.7–15.4)
WBC: 9.5 10*3/uL (ref 3.4–10.8)

## 2020-02-27 LAB — VITAMIN D 25 HYDROXY (VIT D DEFICIENCY, FRACTURES): Vit D, 25-Hydroxy: 36 ng/mL (ref 30.0–100.0)

## 2020-02-27 LAB — T4, FREE: Free T4: 1.26 ng/dL (ref 0.82–1.77)

## 2020-02-27 LAB — TSH: TSH: 2.79 u[IU]/mL (ref 0.450–4.500)

## 2020-03-03 ENCOUNTER — Encounter: Payer: Self-pay | Admitting: Obstetrics and Gynecology

## 2020-03-04 ENCOUNTER — Telehealth: Payer: Self-pay | Admitting: Internal Medicine

## 2020-03-04 NOTE — Telephone Encounter (Signed)
Left message for pt to call back to schedule a fasting appt with vickie

## 2020-03-04 NOTE — Telephone Encounter (Signed)
-----   Message from Avanell Shackleton, NP-C sent at 03/03/2020  4:41 PM EST ----- Needs visit to discuss abnormal labs at gynecologist. Come in fasting please  ----- Message ----- From: Isabell Jarvis, RN Sent: 03/03/2020   3:23 PM EST To: Avanell Shackleton, NP-C  Please review and pt to call for follow up labs.

## 2020-04-22 ENCOUNTER — Ambulatory Visit: Payer: BC Managed Care – PPO | Admitting: Podiatry

## 2020-04-22 ENCOUNTER — Telehealth: Payer: Self-pay | Admitting: Podiatry

## 2020-04-22 ENCOUNTER — Other Ambulatory Visit: Payer: Self-pay

## 2020-04-22 DIAGNOSIS — M79674 Pain in right toe(s): Secondary | ICD-10-CM

## 2020-04-22 DIAGNOSIS — L600 Ingrowing nail: Secondary | ICD-10-CM | POA: Diagnosis not present

## 2020-04-22 MED ORDER — SULFAMETHOXAZOLE-TRIMETHOPRIM 800-160 MG PO TABS: 1.0000 | ORAL_TABLET | Freq: Two times a day (BID) | ORAL | 0 refills | Status: DC

## 2020-04-22 NOTE — Patient Instructions (Signed)

## 2020-04-22 NOTE — Telephone Encounter (Signed)
Patient called stating Mom is currently in Hospice Care and she isnt sure if she will make appointment but she is trying to make it because she is experiencing pain

## 2020-04-27 NOTE — Progress Notes (Signed)
Subjective: 50 year old female presents the office with concerns of ingrown toenail and possibly to the right third toe.  She states that she started some discomfort mild redness of the nail border. She has not seen any drainage or pus.  No red streaks.  She is been soaking Epson salts and the toe is very sore. Denies any systemic complaints such as fevers, chills, nausea, vomiting. No acute changes since last appointment, and no other complaints at this time.   Her mom is in hospice  Objective: AAO x3, NAD DP/PT pulses palpable bilaterally, CRT less than 3 seconds On the right third digit toenail there is incurvation present on the medial plantar border with localized edema and erythema there is no drainage or pus or ascending cellulitis.  Tenderness palpation.  There is no fluctuation, crepitation, malodor. No pain with calf compression, swelling, warmth, erythema  Assessment: Ingrown toenail right third toenail  Plan: -All treatment options discussed with the patient including all alternatives, risks, complications.  -Discussed partial nail avulsion however she is a lot of other stuff going on right now she wants to try to hold off on the procedure.  Recommend to continue Epson salt soaks daily covered with antibiotic ointment.  Prescribed Bactrim.  If no improvement will proceed with a partial nail avulsion. -Patient encouraged to call the office with any questions, concerns, change in symptoms.   Vivi Barrack DPM

## 2020-05-06 ENCOUNTER — Ambulatory Visit: Payer: Self-pay | Admitting: Podiatry

## 2020-07-04 ENCOUNTER — Encounter: Payer: Self-pay | Admitting: Internal Medicine

## 2020-10-27 ENCOUNTER — Encounter: Payer: Self-pay | Admitting: Podiatry

## 2020-10-27 ENCOUNTER — Ambulatory Visit: Payer: BC Managed Care – PPO | Admitting: Podiatry

## 2020-10-27 ENCOUNTER — Other Ambulatory Visit: Payer: Self-pay

## 2020-10-27 DIAGNOSIS — Z79899 Other long term (current) drug therapy: Secondary | ICD-10-CM | POA: Diagnosis not present

## 2020-10-27 DIAGNOSIS — L539 Erythematous condition, unspecified: Secondary | ICD-10-CM

## 2020-10-27 DIAGNOSIS — B351 Tinea unguium: Secondary | ICD-10-CM

## 2020-10-27 MED ORDER — SULFAMETHOXAZOLE-TRIMETHOPRIM 800-160 MG PO TABS: 1.0000 | ORAL_TABLET | Freq: Two times a day (BID) | ORAL | 0 refills | Status: DC

## 2020-10-27 NOTE — Patient Instructions (Signed)
Soak Instructions    THE DAY AFTER THE PROCEDURE  Place 1/4 cup of epsom salts in a quart of warm tap water.  Submerge your foot or feet with outer bandage intact for the initial soak; this will allow the bandage to become moist and wet for easy lift off.  Once you remove your bandage, continue to soak in the solution for 20 minutes.  This soak should be done twice a day.  Next, remove your foot or feet from solution, blot dry the affected area and cover.  You may use a band aid large enough to cover the area or use gauze and tape.  Apply other medications to the area as directed by the doctor such as polysporin neosporin.  IF YOUR SKIN BECOMES IRRITATED WHILE USING THESE INSTRUCTIONS, IT IS OKAY TO SWITCH TO  WHITE VINEGAR AND WATER. Or you may use antibacterial soap and water to keep the toe clean  Monitor for any signs/symptoms of infection. Call the office immediately if any occur or go directly to the emergency room. Call with any questions/concerns.  Terbinafine tablets What is this medication? TERBINAFINE (TER bin a feen) is an antifungal medicine. It is used to treatcertain kinds of fungal or yeast infections. This medicine may be used for other purposes; ask your health care provider orpharmacist if you have questions. COMMON BRAND NAME(S): Lamisil, Terbinex What should I tell my care team before I take this medication? They need to know if you have any of these conditions: drink alcoholic beverages kidney disease liver disease an unusual or allergic reaction to terbinafine, other medicines, foods, dyes, or preservatives pregnant or trying to get pregnant breast-feeding How should I use this medication? Take this medicine by mouth with a full glass of water. Follow the directions on the prescription label. You can take this medicine with food or on an empty stomach. Take your medicine at regular intervals. Do not take your medicine more often than directed. Do not skip doses or  stop your medicine early even ifyou feel better. Do not stop taking except on your doctor's advice. A special MedGuide will be given to you by the pharmacist with eachprescription and refill. Be sure to read this information carefully each time. Talk to your pediatrician regarding the use of this medicine in children.Special care may be needed. Overdosage: If you think you have taken too much of this medicine contact apoison control center or emergency room at once. NOTE: This medicine is only for you. Do not share this medicine with others. What if I miss a dose? If you miss a dose, take it as soon as you can. If it is almost time for yournext dose, take only that dose. Do not take double or extra doses. What may interact with this medication? Do not take this medicine with any of the following medications: thioridazine This medicine may also interact with the following medications: beta-blockers caffeine cimetidine cyclosporine medicines for depression, anxiety, or psychotic disturbances medicines for fungal infections like fluconazole and ketoconazole medicines for irregular heartbeat like amiodarone, flecainide and propafenone rifampin warfarin This list may not describe all possible interactions. Give your health care provider a list of all the medicines, herbs, non-prescription drugs, or dietary supplements you use. Also tell them if you smoke, drink alcohol, or use illegaldrugs. Some items may interact with your medicine. What should I watch for while using this medication? Visit your doctor or health care provider regularly. Tell your doctor right away if you have nausea or  vomiting, loss of appetite, stomach pain on your right upper side, yellow skin, dark urine, light stools, or are over tired. Some fungal infections need many weeks or months of treatment to cure. If you are taking this medicine for a long time, you will need to have important bloodwork done. This medicine may cause  serious skin reactions. They can happen weeks to months after starting the medicine. Contact your health care provider right away if you notice fevers or flu-like symptoms with a rash. The rash may be red or purple and then turn into blisters or peeling of the skin. Or, you might notice a red rash with swelling of the face, lips or lymph nodes in your neck or underyour arms. What side effects may I notice from receiving this medication? Side effects that you should report to your doctor or health care professionalas soon as possible: allergic reactions like skin rash or hives, swelling of the face, lips, or tongue changes in vision dark urine fever or infection general ill feeling or flu-like symptoms light-colored stools loss of appetite, nausea rash, fever, and swollen lymph nodes redness, blistering, peeling or loosening of the skin, including inside the mouth right upper belly pain unusually weak or tired yellowing of the eyes or skin Side effects that usually do not require medical attention (report to yourdoctor or health care professional if they continue or are bothersome): changes in taste diarrhea hair loss muscle or joint pain stomach gas stomach upset This list may not describe all possible side effects. Call your doctor for medical advice about side effects. You may report side effects to FDA at1-800-FDA-1088. Where should I keep my medication? Keep out of the reach of children. Store at room temperature below 25 degrees C (77 degrees F). Protect fromlight. Throw away any unused medicine after the expiration date. NOTE: This sheet is a summary. It may not cover all possible information. If you have questions about this medicine, talk to your doctor, pharmacist, orhealth care provider.  2022 Elsevier/Gold Standard (2018-06-23 15:37:07)

## 2020-10-28 ENCOUNTER — Other Ambulatory Visit: Payer: Self-pay | Admitting: Podiatry

## 2020-10-28 DIAGNOSIS — Z79899 Other long term (current) drug therapy: Secondary | ICD-10-CM

## 2020-10-28 LAB — CBC WITH DIFFERENTIAL/PLATELET
Absolute Monocytes: 670 cells/uL (ref 200–950)
Basophils Absolute: 50 cells/uL (ref 0–200)
Basophils Relative: 0.5 %
Eosinophils Absolute: 420 cells/uL (ref 15–500)
Eosinophils Relative: 4.2 %
HCT: 39.5 % (ref 35.0–45.0)
Hemoglobin: 12.9 g/dL (ref 11.7–15.5)
Lymphs Abs: 3120 cells/uL (ref 850–3900)
MCH: 28.4 pg (ref 27.0–33.0)
MCHC: 32.7 g/dL (ref 32.0–36.0)
MCV: 87 fL (ref 80.0–100.0)
MPV: 10.9 fL (ref 7.5–12.5)
Monocytes Relative: 6.7 %
Neutro Abs: 5740 cells/uL (ref 1500–7800)
Neutrophils Relative %: 57.4 %
Platelets: 234 10*3/uL (ref 140–400)
RBC: 4.54 10*6/uL (ref 3.80–5.10)
RDW: 13.7 % (ref 11.0–15.0)
Total Lymphocyte: 31.2 %
WBC: 10 10*3/uL (ref 3.8–10.8)

## 2020-10-28 LAB — HEPATIC FUNCTION PANEL
AG Ratio: 1.8 (calc) (ref 1.0–2.5)
ALT: 20 U/L (ref 6–29)
AST: 18 U/L (ref 10–35)
Albumin: 4.4 g/dL (ref 3.6–5.1)
Alkaline phosphatase (APISO): 115 U/L (ref 37–153)
Bilirubin, Direct: 0.1 mg/dL (ref 0.0–0.2)
Globulin: 2.5 g/dL (calc) (ref 1.9–3.7)
Indirect Bilirubin: 0.5 mg/dL (calc) (ref 0.2–1.2)
Total Bilirubin: 0.6 mg/dL (ref 0.2–1.2)
Total Protein: 6.9 g/dL (ref 6.1–8.1)

## 2020-10-28 MED ORDER — TERBINAFINE HCL 250 MG PO TABS: 250.0000 mg | ORAL_TABLET | Freq: Every day | ORAL | 0 refills | Status: DC

## 2020-10-29 NOTE — Progress Notes (Signed)
Subjective: 50 year old female presents the office today for concerns of tenderness along the left big.  She states that she noticed discomfort on Friday night when she has some localized swelling redness at the tip of the toe.  Denies any drainage or pus or any other issues.  No injury.  No recent treatment.  Also concern about discoloration to other toenails.  Denies any systemic complaints such as fevers, chills, nausea, vomiting. No acute changes since last appointment, and no other complaints at this time.   Objective: AAO x3, NAD DP/PT pulses palpable bilaterally, CRT less than 3 seconds At the distal aspect of the left hallux toe there is a localized area of edema and erythema.  There is a small amount of skin that has built up in the area which is causing this there is no drainage or pus.  No ascending cellulitis.  No fluctuance or crepitation there is no malodor.  No open lesions or pre-ulcerative lesions.  The nails in general also hypertrophic, dystrophic with yellow-brown discoloration of the lesser digit nails. No pain with calf compression, swelling, warmth, erythema  Assessment: 50 year old female with left hallux localized erythema; onychomycosis  Plan: -All treatment options discussed with the patient including all alternatives, risks, complications.  -I debrided the loose tissue today without any complications or bleeding.  Recommended continue Epson salt soaks cover with antibiotic ointment dressing changes daily.  Prescribed Bactrim.  Monitor for any signs or symptoms of infection call me know immediately should any occur if there is any worsening. -Discussed treatment options for nail fungus including oral, topical as well as alternative treatments.  She elects to proceed with oral treatment.  We will check a CBC and LFT before starting Lamisil. -Patient encouraged to call the office with any questions, concerns, change in symptoms.   Leah Estrada DPM

## 2020-12-30 ENCOUNTER — Ambulatory Visit: Payer: BC Managed Care – PPO | Admitting: Podiatry

## 2021-01-01 LAB — CBC WITH DIFFERENTIAL/PLATELET
Absolute Monocytes: 529 cells/uL (ref 200–950)
Basophils Absolute: 32 cells/uL (ref 0–200)
Basophils Relative: 0.4 %
Eosinophils Absolute: 419 cells/uL (ref 15–500)
Eosinophils Relative: 5.3 %
HCT: 41.6 % (ref 35.0–45.0)
Hemoglobin: 13.8 g/dL (ref 11.7–15.5)
Lymphs Abs: 2710 cells/uL (ref 850–3900)
MCH: 28.8 pg (ref 27.0–33.0)
MCHC: 33.2 g/dL (ref 32.0–36.0)
MCV: 86.8 fL (ref 80.0–100.0)
MPV: 10.5 fL (ref 7.5–12.5)
Monocytes Relative: 6.7 %
Neutro Abs: 4211 cells/uL (ref 1500–7800)
Neutrophils Relative %: 53.3 %
Platelets: 241 10*3/uL (ref 140–400)
RBC: 4.79 10*6/uL (ref 3.80–5.10)
RDW: 13.4 % (ref 11.0–15.0)
Total Lymphocyte: 34.3 %
WBC: 7.9 10*3/uL (ref 3.8–10.8)

## 2021-01-01 LAB — HEPATIC FUNCTION PANEL
AG Ratio: 1.6 (calc) (ref 1.0–2.5)
ALT: 18 U/L (ref 6–29)
AST: 17 U/L (ref 10–35)
Albumin: 4.4 g/dL (ref 3.6–5.1)
Alkaline phosphatase (APISO): 127 U/L (ref 37–153)
Bilirubin, Direct: 0.1 mg/dL (ref 0.0–0.2)
Globulin: 2.7 g/dL (calc) (ref 1.9–3.7)
Indirect Bilirubin: 0.4 mg/dL (calc) (ref 0.2–1.2)
Total Bilirubin: 0.5 mg/dL (ref 0.2–1.2)
Total Protein: 7.1 g/dL (ref 6.1–8.1)

## 2021-01-23 ENCOUNTER — Other Ambulatory Visit: Payer: Self-pay

## 2021-01-23 ENCOUNTER — Ambulatory Visit: Payer: BC Managed Care – PPO | Admitting: Nurse Practitioner

## 2021-01-23 ENCOUNTER — Encounter: Payer: Self-pay | Admitting: Nurse Practitioner

## 2021-01-23 VITALS — BP 124/82

## 2021-01-23 DIAGNOSIS — N898 Other specified noninflammatory disorders of vagina: Secondary | ICD-10-CM

## 2021-01-23 DIAGNOSIS — B3731 Acute candidiasis of vulva and vagina: Secondary | ICD-10-CM | POA: Diagnosis not present

## 2021-01-23 LAB — WET PREP FOR TRICH, YEAST, CLUE

## 2021-01-23 MED ORDER — FLUCONAZOLE 150 MG PO TABS: 150.0000 mg | ORAL_TABLET | ORAL | 0 refills | Status: DC

## 2021-01-23 MED ORDER — TERCONAZOLE 0.8 % VA CREA: 1.0000 | TOPICAL_CREAM | Freq: Every day | VAGINAL | 0 refills | Status: DC

## 2021-01-23 NOTE — Progress Notes (Addendum)
   Acute Office Visit  Subjective:    Patient ID: Leah Estrada, female    DOB: Aug 23, 1970, 50 y.o.   MRN: [id number]   HPI 49 y.o. presents today for vaginal discharge, itching, and irritation that started 1 week ago. Used OTC vagasil with some external relief.    Review of Systems  Constitutional: Negative.   Genitourinary:  Positive for vaginal discharge.       Vaginal itching and irritation      Objective:    Physical Exam Constitutional:      Appearance: Normal appearance.  Genitourinary:    General: Normal vulva.     Vagina: Vaginal discharge and erythema present.     Cervix: Normal.    BP 124/82  Wt Readings from Last 3 Encounters:  02/26/20 192 lb (87.1 kg)  01/15/20 194 lb (88 kg)  09/13/19 197 lb 9.6 oz (89.6 kg)   Wet prep + yeast (hyphae & pseudohyphae noted)     Assessment & Plan:   Problem List Items Addressed This Visit   None Visit Diagnoses     Vulvovaginal candidiasis    -  Primary   Relevant Medications   terconazole (TERAZOL 3) 0.8 % vaginal cream   Vaginal itching       Relevant Orders   WET PREP FOR TRICH, YEAST, CLUE (Completed)       Plan: Wet prep positive for yeast - Terazol-3 provided. On Terbinafine for toenail fungus, so we will avoid oral treatment at this time per pharmacy recommendation. She will return if symptoms worsen or do not improve.     Leah Mackie DNP, 11:41 AM 01/23/2021

## 2021-01-23 NOTE — Addendum Note (Signed)
Addended byWyline Beady on: 01/23/2021 11:42 AM   Modules accepted: Orders

## 2021-01-27 ENCOUNTER — Encounter: Payer: Self-pay | Admitting: Nurse Practitioner

## 2021-01-28 ENCOUNTER — Other Ambulatory Visit: Payer: Self-pay | Admitting: Nurse Practitioner

## 2021-01-28 DIAGNOSIS — B3731 Acute candidiasis of vulva and vagina: Secondary | ICD-10-CM

## 2021-01-28 MED ORDER — TERCONAZOLE 0.8 % VA CREA: 1.0000 | TOPICAL_CREAM | Freq: Every day | VAGINAL | 0 refills | Status: DC

## 2021-01-29 ENCOUNTER — Ambulatory Visit: Payer: BC Managed Care – PPO | Admitting: Podiatry

## 2021-02-03 ENCOUNTER — Ambulatory Visit (INDEPENDENT_AMBULATORY_CARE_PROVIDER_SITE_OTHER): Payer: BC Managed Care – PPO | Admitting: Obstetrics and Gynecology

## 2021-02-03 ENCOUNTER — Encounter: Payer: Self-pay | Admitting: Obstetrics and Gynecology

## 2021-02-03 ENCOUNTER — Other Ambulatory Visit: Payer: Self-pay

## 2021-02-03 VITALS — BP 118/66 | HR 64 | Ht 61.25 in | Wt 190.0 lb

## 2021-02-03 DIAGNOSIS — N760 Acute vaginitis: Secondary | ICD-10-CM | POA: Diagnosis not present

## 2021-02-03 LAB — WET PREP FOR TRICH, YEAST, CLUE

## 2021-02-03 MED ORDER — CLINDAMYCIN PHOSPHATE 2 % VA CREA: 1.0000 | TOPICAL_CREAM | Freq: Every day | VAGINAL | 0 refills | Status: DC

## 2021-02-03 MED ORDER — TRIAMCINOLONE ACETONIDE 0.025 % EX OINT: 1.0000 "application " | TOPICAL_OINTMENT | Freq: Two times a day (BID) | CUTANEOUS | 0 refills | Status: DC

## 2021-02-03 NOTE — Progress Notes (Signed)
GYNECOLOGY  VISIT   HPI: 50 y.o.   Married  Caucasian  female   G1P1001 with Patient's last menstrual period was 03/29/2020 (approximate).   here for external vaginal irritation. Patient doesn't feel it has completely resolved.    Itching is more external.  She tried OTC Vagisil and coconut oil.   No discharge or odor.   No recent antibiotic.  Seen and treated for yeast with Terazol 3 at office visit on 01/23/21.  She had a repeat dosage on 01/28/21.  Last dosage was 2 days ago. She is on Terbinafine and preference to avoid oral treatment for yeast.   No vaginal dryness.  GYNECOLOGIC HISTORY: Patient's last menstrual period was 03/29/2020 (approximate). Contraception: Paragard IUD 02/02/11 Menopausal hormone therapy:  none Last mammogram:  10-30-20 3D/Neg/BiRads1 Last pap smear:  10-17-18 Neg:Neg HR HPV,10-07-16 neg, 10-07-15 Neg:Neg HR HPV        OB History     Gravida  1   Para  1   Term  1   Preterm      AB      Living  1      SAB      IAB      Ectopic      Multiple      Live Births  1              Patient Active Problem List   Diagnosis Date Noted   Ingrown toenail 08/16/2017   Onychomycosis 11/10/2016    Past Medical History:  Diagnosis Date   Abnormal Pap smear of cervix    2015, 2016 ASCUS HPV HR neg   COVID-19 virus infection 02/2019   Thyroid disease    hypothyroidism    Past Surgical History:  Procedure Laterality Date   INTRAUTERINE DEVICE INSERTION     inserted 02/02/11, removal 02/01/21    Current Outpatient Medications  Medication Sig Dispense Refill   Ascorbic Acid (VITAMIN C) 1000 MG tablet Take 1,000 mg by mouth daily.     Cholecalciferol (VITAMIN D3) 50 MCG (2000 UT) TABS Take 1 tablet by mouth daily.     Fexofenadine HCl (ALLEGRA PO) Take by mouth daily.     levothyroxine (SYNTHROID) 75 MCG tablet TAKE 1 TABLET EVERY DAY 90 tablet 3   medroxyPROGESTERone (PROVERA) 10 MG tablet Take 1 tablet (10 mg total) by mouth daily.  Take for 10 days every 3 months if you are not having a spontaneous menstrual period. (Patient not taking: No sig reported) 20 tablet 1   Probiotic Product (PROBIOTIC PO) Take by mouth. Takes 2     terbinafine (LAMISIL) 250 MG tablet Take 1 tablet (250 mg total) by mouth daily. 90 tablet 0   terconazole (TERAZOL 3) 0.8 % vaginal cream Place 1 applicator vaginally at bedtime. (Patient not taking: Reported on 02/03/2021) 20 g 0   No current facility-administered medications for this visit.     ALLERGIES: Cephalexin and Flagyl [metronidazole]  Family History  Problem Relation Age of Onset   Breast cancer Mother    Heart disease Mother    Hyperlipidemia Mother    Pancreatic cancer Mother    Cancer Mother        pancreatic   Heart disease Father    Breast cancer Maternal Aunt    Heart disease Maternal Grandmother    Breast cancer Maternal Aunt     Social History   Socioeconomic History   Marital status: Married    Spouse name: Not on file  Number of children: Not on file   Years of education: Not on file   Highest education level: Not on file  Occupational History   Not on file  Tobacco Use   Smoking status: Never   Smokeless tobacco: Never  Vaping Use   Vaping Use: Never used  Substance and Sexual Activity   Alcohol use: No   Drug use: No   Sexual activity: Yes    Partners: Male    Birth control/protection: I.U.D.    Comment: Paragard IUD  02-02-11  Other Topics Concern   Not on file  Social History Narrative   Not on file   Social Determinants of Health   Financial Resource Strain: Not on file  Food Insecurity: Not on file  Transportation Needs: Not on file  Physical Activity: Not on file  Stress: Not on file  Social Connections: Not on file  Intimate Partner Violence: Not on file    Review of Systems  Genitourinary:  Positive for vaginal pain (external vaginal irritation).  All other systems reviewed and are negative.  PHYSICAL EXAMINATION:    BP  118/66   Pulse 64   Ht 5' 1.25" (1.556 m)   Wt 190 lb (86.2 kg)   LMP 03/29/2020 (Approximate)   SpO2 98%   BMI 35.61 kg/m     General appearance: alert, cooperative and appears stated age    Pelvic: External genitalia:  no lesions,  mild erythema of the labia minora and introitus.              Urethra:  normal appearing urethra with no masses, tenderness or lesions              Bartholins and Skenes: normal                 Vagina: normal appearing vagina with normal color and discharge, no lesions              Cervix: no lesions.  IUD strings noted.                 Bimanual Exam:  Uterus:  normal size, contour, position, consistency, mobility, non-tender              Adnexa: no mass, fullness, tenderness        Chaperone was present for exam:  Irving Burton, RN  ASSESSMENT  Vulvovaginitis.  Recent treatment for yeast infection  Paragard IUD.   PLAN  Wet prep:  Positive odor and clue cells, negative yeast and trichomonas.  Clindamycin cream pv at hs x 7 nights.  Triamcinolone ointment .255 bid x 1 week prn.  Continue Allegra for itching.  Vag vit E suppositories for vaginal dryness.  FU prn.    An After Visit Summary was printed and given to the patient.  20 min  total time was spent for this patient encounter, including preparation, face-to-face counseling with the patient, coordination of care, and documentation of the encounter.

## 2021-03-04 ENCOUNTER — Other Ambulatory Visit: Payer: Self-pay | Admitting: Obstetrics and Gynecology

## 2021-03-04 DIAGNOSIS — E039 Hypothyroidism, unspecified: Secondary | ICD-10-CM

## 2021-03-04 NOTE — Telephone Encounter (Signed)
TSH normal at AEX 02/26/20. AEX scheduled 03/26/21.

## 2021-03-09 ENCOUNTER — Ambulatory Visit: Payer: BC Managed Care – PPO | Admitting: Obstetrics and Gynecology

## 2021-03-23 ENCOUNTER — Encounter: Payer: Self-pay | Admitting: Nurse Practitioner

## 2021-03-24 ENCOUNTER — Other Ambulatory Visit: Payer: Self-pay | Admitting: Nurse Practitioner

## 2021-03-24 DIAGNOSIS — E785 Hyperlipidemia, unspecified: Secondary | ICD-10-CM

## 2021-03-24 DIAGNOSIS — Z01419 Encounter for gynecological examination (general) (routine) without abnormal findings: Secondary | ICD-10-CM

## 2021-03-24 DIAGNOSIS — E039 Hypothyroidism, unspecified: Secondary | ICD-10-CM

## 2021-03-24 DIAGNOSIS — E559 Vitamin D deficiency, unspecified: Secondary | ICD-10-CM

## 2021-03-24 NOTE — Telephone Encounter (Signed)
Leah Estrada patient is scheduled for annual exam on 03/26/21. Sounds like she wants labs done prior to annual exam.

## 2021-03-26 ENCOUNTER — Other Ambulatory Visit: Payer: Self-pay

## 2021-03-26 ENCOUNTER — Other Ambulatory Visit: Payer: BC Managed Care – PPO

## 2021-03-26 ENCOUNTER — Ambulatory Visit (INDEPENDENT_AMBULATORY_CARE_PROVIDER_SITE_OTHER): Payer: BC Managed Care – PPO | Admitting: Nurse Practitioner

## 2021-03-26 ENCOUNTER — Encounter: Payer: Self-pay | Admitting: Nurse Practitioner

## 2021-03-26 VITALS — BP 118/84 | HR 76 | Ht 60.25 in | Wt 188.0 lb

## 2021-03-26 DIAGNOSIS — N926 Irregular menstruation, unspecified: Secondary | ICD-10-CM | POA: Diagnosis not present

## 2021-03-26 DIAGNOSIS — E039 Hypothyroidism, unspecified: Secondary | ICD-10-CM | POA: Diagnosis not present

## 2021-03-26 DIAGNOSIS — Z01419 Encounter for gynecological examination (general) (routine) without abnormal findings: Secondary | ICD-10-CM | POA: Diagnosis not present

## 2021-03-26 DIAGNOSIS — E785 Hyperlipidemia, unspecified: Secondary | ICD-10-CM

## 2021-03-26 DIAGNOSIS — N951 Menopausal and female climacteric states: Secondary | ICD-10-CM

## 2021-03-26 DIAGNOSIS — Z30431 Encounter for routine checking of intrauterine contraceptive device: Secondary | ICD-10-CM | POA: Diagnosis not present

## 2021-03-26 DIAGNOSIS — E559 Vitamin D deficiency, unspecified: Secondary | ICD-10-CM

## 2021-03-26 NOTE — Progress Notes (Signed)
Keara Pagliarulo 1970-11-25 [id number]   History:  50 y.o. G1P1001 presents for annual exam. Paragard 01/2011. LMP 03/05/2021 (spontaneous), prior to that it was 6 months with Provera use. She has been using Provera for menses every 3 months for over a year now. Recent bleeding episode was like a normal period. She does have some mild menopausal symptoms. 2015/2016 ASCUS negative HPV with negative biopsy but showed severe acute and chronic cervicitis.  History of hypothyroidism. She had labs drawn at our office this morning.  Gynecologic History Patient's last menstrual period was 03/05/2021 (approximate).   Contraception/Family planning: IUD Sexually active: Yes  Health Maintenance Last Pap: 10/17/2018. Results were: Normal, 5-year repeat Last mammogram: 10/30/2020. Results were: Normal Last colonoscopy: Never Last Dexa: Not indicated  Past medical history, past surgical history, family history and social history were all reviewed and documented in the EPIC chart. 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 19 yo daughter, EMT. Mother and 2 maternal aunts with breast cancer history. Patient declines genetic screening.   ROS:  A ROS was performed and pertinent positives and negatives are included.  Exam:  Vitals:   03/26/21 1522  BP: 118/84  Pulse: 76  SpO2: 98%  Weight: 188 lb (85.3 kg)  Height: 5' 0.25" (1.53 m)   Body mass index is 36.41 kg/m.  General appearance:  Normal Thyroid:  Symmetrical, normal in size, without palpable masses or nodularity. Respiratory  Auscultation:  Clear without wheezing or rhonchi Cardiovascular  Auscultation:  Regular rate, without rubs, murmurs or gallops  Edema/varicosities:  Not grossly evident Abdominal  Soft,nontender, without masses, guarding or rebound.  Liver/spleen:  No organomegaly noted  Hernia:  None appreciated  Skin  Inspection:  Grossly normal Breasts: Examined lying and sitting.   Right: Without masses, retractions, nipple  discharge or axillary adenopathy.   Left: Without masses, retractions, nipple discharge or axillary adenopathy. Genitourinary   Inguinal/mons:  Normal without inguinal adenopathy  External genitalia:  Normal appearing vulva with no masses, tenderness, or lesions  BUS/Urethra/Skene's glands:  Normal  Vagina:  Normal appearing with normal color and discharge, no lesions  Cervix:  Normal appearing without discharge or lesions. IUD strings noted  Uterus:  Normal in size, shape and contour.  Midline and mobile, nontender  Adnexa/parametria:     Rt: Normal in size, without masses or tenderness.   Lt: Normal in size, without masses or tenderness.  Anus and perineum: Normal  Digital rectal exam: Normal sphincter tone without palpated masses or tenderness  Patient informed chaperone available to be present for breast and pelvic exam. Patient has requested no chaperone to be present. Patient has been advised what will be completed during breast and pelvic exam.   Assessment/Plan:  50 y.o. G1P1001 for annual exam.   Well female exam with routine gynecological exam - Education provided on SBEs, importance of preventative screenings, current guidelines, high calcium diet, regular exercise, and multivitamin daily.  Labs obtained this morning - CBC, CMP, lipid panel, vitamin D, TSH.   Encounter for routine checking of intrauterine contraceptive device (IUD) - Paragard 01/2011. We discussed current literature regarding extended use of Paragard for up to 12 years. Based on patients age, perimenopausal status, and recommendations we both agree to keep in place and will reassess next year.   Hypothyroidism, unspecified type - on Synthroid. TSH pending.   Irregular menses - LMP 03/05/2021 (spontaneous), prior to that it was 6 months with Provera use. Bleeding was like a normal period. She does have  some mild menopausal symptoms. Will add FSH and estradiol to blood work. If postmenopausal status we will proceed  with ultrasound.   Screening for cervical cancer - Normal Pap history.  Will repeat at 5-year interval per guidelines.  Screening for breast cancer - Normal mammogram history.  Continue annual screenings.  Normal breast exam today.  Screening for colon cancer - Has not had screening colonoscopy. Wants to plan to spring during school break. Information provided on [facility].   Return in 1 year for annual.   [name] DNP, 3:50 PM 03/26/2021

## 2021-03-26 NOTE — Patient Instructions (Signed)
Schedule Colonoscopy! ?[facility] ?[phone] ?[address] [place], [place] [address] ? ?

## 2021-03-27 ENCOUNTER — Other Ambulatory Visit: Payer: Self-pay | Admitting: Obstetrics and Gynecology

## 2021-03-27 DIAGNOSIS — E039 Hypothyroidism, unspecified: Secondary | ICD-10-CM

## 2021-03-28 LAB — CBC WITH DIFFERENTIAL/PLATELET
Absolute Monocytes: 574 cells/uL (ref 200–950)
Basophils Absolute: 70 cells/uL (ref 0–200)
Basophils Relative: 0.8 %
Eosinophils Absolute: 374 cells/uL (ref 15–500)
Eosinophils Relative: 4.3 %
HCT: 45.8 % — ABNORMAL HIGH (ref 35.0–45.0)
Hemoglobin: 14.7 g/dL (ref 11.7–15.5)
Lymphs Abs: 2688 cells/uL (ref 850–3900)
MCH: 28.5 pg (ref 27.0–33.0)
MCHC: 32.1 g/dL (ref 32.0–36.0)
MCV: 88.8 fL (ref 80.0–100.0)
MPV: 10.6 fL (ref 7.5–12.5)
Monocytes Relative: 6.6 %
Neutro Abs: 4994 cells/uL (ref 1500–7800)
Neutrophils Relative %: 57.4 %
Platelets: 266 10*3/uL (ref 140–400)
RBC: 5.16 10*6/uL — ABNORMAL HIGH (ref 3.80–5.10)
RDW: 13.3 % (ref 11.0–15.0)
Total Lymphocyte: 30.9 %
WBC: 8.7 10*3/uL (ref 3.8–10.8)

## 2021-03-28 LAB — COMPREHENSIVE METABOLIC PANEL
AG Ratio: 1.8 (calc) (ref 1.0–2.5)
ALT: 29 U/L (ref 6–29)
AST: 25 U/L (ref 10–35)
Albumin: 4.6 g/dL (ref 3.6–5.1)
Alkaline phosphatase (APISO): 124 U/L (ref 37–153)
BUN/Creatinine Ratio: 14 (calc) (ref 6–22)
BUN: 15 mg/dL (ref 7–25)
CO2: 29 mmol/L (ref 20–32)
Calcium: 9.7 mg/dL (ref 8.6–10.4)
Chloride: 103 mmol/L (ref 98–110)
Creat: 1.11 mg/dL — ABNORMAL HIGH (ref 0.50–1.03)
Globulin: 2.6 g/dL (calc) (ref 1.9–3.7)
Glucose, Bld: 76 mg/dL (ref 65–99)
Potassium: 4.3 mmol/L (ref 3.5–5.3)
Sodium: 141 mmol/L (ref 135–146)
Total Bilirubin: 0.7 mg/dL (ref 0.2–1.2)
Total Protein: 7.2 g/dL (ref 6.1–8.1)

## 2021-03-28 LAB — TSH: TSH: 4.93 mIU/L — ABNORMAL HIGH

## 2021-03-28 LAB — VITAMIN D 25 HYDROXY (VIT D DEFICIENCY, FRACTURES): Vit D, 25-Hydroxy: 40 ng/mL (ref 30–100)

## 2021-03-28 LAB — LIPID PANEL
Cholesterol: 172 mg/dL (ref <200)
HDL: 44 mg/dL — ABNORMAL LOW (ref >=50)
LDL Cholesterol (Calc): 104 mg/dL (calc) — ABNORMAL HIGH
Non-HDL Cholesterol (Calc): 128 mg/dL (calc) (ref <130)
Total CHOL/HDL Ratio: 3.9 (calc) (ref <5.0)
Triglycerides: 144 mg/dL (ref <150)

## 2021-03-28 LAB — FOLLICLE STIMULATING HORMONE: FSH: 55 m[IU]/mL

## 2021-03-28 LAB — ESTRADIOL: Estradiol: <15 pg/mL

## 2021-03-31 ENCOUNTER — Telehealth: Payer: Self-pay

## 2021-03-31 ENCOUNTER — Other Ambulatory Visit: Payer: Self-pay

## 2021-03-31 ENCOUNTER — Other Ambulatory Visit: Payer: Self-pay | Admitting: Nurse Practitioner

## 2021-03-31 DIAGNOSIS — N950 Postmenopausal bleeding: Secondary | ICD-10-CM

## 2021-03-31 DIAGNOSIS — E039 Hypothyroidism, unspecified: Secondary | ICD-10-CM

## 2021-03-31 DIAGNOSIS — R7989 Other specified abnormal findings of blood chemistry: Secondary | ICD-10-CM

## 2021-03-31 DIAGNOSIS — D751 Secondary polycythemia: Secondary | ICD-10-CM

## 2021-03-31 MED ORDER — LEVOTHYROXINE SODIUM 75 MCG PO TABS: 75.0000 ug | ORAL_TABLET | Freq: Every day | ORAL | 1 refills | Status: DC

## 2021-03-31 MED ORDER — LEVOTHYROXINE SODIUM 88 MCG PO TABS: 88.0000 ug | ORAL_TABLET | Freq: Every day | ORAL | 0 refills | Status: DC

## 2021-03-31 NOTE — Telephone Encounter (Signed)
So it is actually recommended that vitamins and supplements be taken 4 hours after thyroid medication. We could keep her on her previous dose and recheck in 6 weeks after she has stopped taking her other medications so closely if she prefers.

## 2021-03-31 NOTE — Telephone Encounter (Signed)
Spoke with patient. She opts to stay on current dose 75 mcg and recheck TSH with other labs in 6 weeks. Lab appt scheduled. Rx sent. She is going to see if she can return the 88 mcg that she picked up earlier as bag is still stapled shut.

## 2021-03-31 NOTE — Telephone Encounter (Signed)
Patient told me that she has been waiting 30 minutes and then taking her multivitamin after taking her Levothyroxine.  Sometimes she takes Vit D, Probiotic, Allergy pill as well 30 minutes after medication.  She wanted to make sure you thought that was okay and not affecting her medication working.

## 2021-03-31 NOTE — Telephone Encounter (Signed)
-----   Message from Tamela Gammon, NP sent at 03/31/2021  8:35 AM EST ----- Please let Leah Estrada know her TSH is slightly elevated indicating under activity. I recommend we increase to 88 mcg daily and have her return for TSH recheck in 6 weeks. Make sure she is taking each morning without any other medications or supplements and 30-60 minutes before consuming any food or drink.   Her [place] and estradiol indicate postmenopause, so I recommend she return for ultrasound due to recent bleeding episode.   Her rbc, hct, and creatinine are slightly elevated. Many times this is caused by dehydration. I would like to recheck these as well when she comes back for TSH.

## 2021-04-14 ENCOUNTER — Telehealth: Payer: Self-pay | Admitting: Obstetrics and Gynecology

## 2021-04-14 ENCOUNTER — Other Ambulatory Visit: Payer: Self-pay

## 2021-04-14 ENCOUNTER — Ambulatory Visit: Payer: BC Managed Care – PPO

## 2021-04-14 DIAGNOSIS — N950 Postmenopausal bleeding: Secondary | ICD-10-CM | POA: Diagnosis not present

## 2021-04-14 NOTE — Telephone Encounter (Signed)
Phone call to patient in follow up to pelvic ultrasound done today for postmenopausal bleeding.   No answer, so I left a message that I called and would try to reach her again tomorrow.   Uterus shows right arm of IUD partially embedded in the myometrium.  She also has a simple left ovarian cyst measuring 3.1 x 2.5 cm.   She has been doing extended use of her ParaGard IUD.   I recommend removal of the IUD with me in the office setting.   The cyst is simple in nature and does not have concerning features.  This does not need follow up at this time.

## 2021-04-14 NOTE — Telephone Encounter (Signed)
Patient returned my phone call.   I reviewed her US showing the right IUD arm partially embedded in the myometrium.    This can explain her postmenopausal bleeding.   I recommended removal with me in the office.  I discussed the possibility of fracture of the IUD and retained IUD piece with the removal process.  This could then need surgery to remove any remaining portion.   Will plan for a paracervical block with removal.  She will take Ibuprofen 600 - 800 mg in preparation for the visit.   She will call in the am to schedule the appointment.

## 2021-04-15 NOTE — Telephone Encounter (Signed)
Appt scheduled 04/20/21

## 2021-04-16 ENCOUNTER — Other Ambulatory Visit: Payer: BC Managed Care – PPO

## 2021-04-16 ENCOUNTER — Other Ambulatory Visit: Payer: BC Managed Care – PPO | Admitting: Obstetrics and Gynecology

## 2021-04-20 ENCOUNTER — Ambulatory Visit: Payer: BC Managed Care – PPO | Admitting: Obstetrics and Gynecology

## 2021-04-22 NOTE — Progress Notes (Signed)
GYNECOLOGY  VISIT   HPI: 51 y.o.   Married  Caucasian  female   G1P1001 with No LMP recorded. (Menstrual status: Perimenopausal).   here for IUD removal.     Patient had an episode of postmenopausal bleeding and pelvic US 04/14/21 showed right wing is penetrating into the myometrium.   Her EMS is 2.14 mm.  Her right ovary was normal and her left ovary showed a 2.8 cm simple ovarian cyst.  No free fluid was noted.   Paragard IUD placed in 2012.   FSH 55 on 03/26/21.   Notes onset of breast tenderness. Asking if she needs pregnancy prevention once the IUD is removed.  Took Advil 800 mg prior to appointment today.   GYNECOLOGIC HISTORY: No LMP recorded. (Menstrual status: Perimenopausal). Contraception:  Paragard IUD 2012 Menopausal hormone therapy:  n/a Last mammogram:  10-30-20 Neg/Birads1 Last pap smear:   10-17-18 Neg:Neg HR HPV, 10-07-16 Neg, 10-07-15 Neg:Neg HR HPV        OB History     Gravida  1   Para  1   Term  1   Preterm      AB      Living  1      SAB      IAB      Ectopic      Multiple      Live Births  1              Patient Active Problem List   Diagnosis Date Noted   Ingrown toenail 08/16/2017   Onychomycosis 11/10/2016    Past Medical History:  Diagnosis Date   Abnormal Pap smear of cervix    2015, 2016 ASCUS HPV HR neg   COVID-19 virus infection 02/2019   Thyroid disease    hypothyroidism    Past Surgical History:  Procedure Laterality Date   INTRAUTERINE DEVICE INSERTION     inserted 02/02/11, removal 02/01/21    Current Outpatient Medications  Medication Sig Dispense Refill   Ascorbic Acid (VITAMIN C) 1000 MG tablet Take 1,000 mg by mouth daily.     Cholecalciferol (VITAMIN D3) 50 MCG (2000 UT) TABS Take 1 tablet by mouth daily.     Fexofenadine HCl (ALLEGRA PO) Take by mouth daily.     levothyroxine (SYNTHROID) 75 MCG tablet Take 1 tablet (75 mcg total) by mouth daily before breakfast. 30 tablet 1   levothyroxine  (SYNTHROID) 88 MCG tablet Take 88 mcg by mouth every morning.     medroxyPROGESTERone (PROVERA) 10 MG tablet Take 1 tablet (10 mg total) by mouth daily. Take for 10 days every 3 months if you are not having a spontaneous menstrual period. 20 tablet 1   Probiotic Product (PROBIOTIC PO) Take by mouth. Takes 2     terbinafine (LAMISIL) 250 MG tablet Take 1 tablet (250 mg total) by mouth daily. 90 tablet 0   triamcinolone (KENALOG) 0.025 % ointment Apply 1 application topically 2 (two) times daily. Use for one week at a time. 30 g 0   No current facility-administered medications for this visit.     ALLERGIES: Cephalexin and Flagyl [metronidazole]  Family History  Problem Relation Age of Onset   Breast cancer Mother    Heart disease Mother    Hyperlipidemia Mother    Pancreatic cancer Mother    Cancer Mother        pancreatic   Heart disease Father    Breast cancer Maternal Aunt    Heart disease  Maternal Grandmother    Breast cancer Maternal Aunt     Social History   Socioeconomic History   Marital status: Married    Spouse name: Not on file   Number of children: Not on file   Years of education: Not on file   Highest education level: Not on file  Occupational History   Not on file  Tobacco Use   Smoking status: Never   Smokeless tobacco: Never  Vaping Use   Vaping Use: Never used  Substance and Sexual Activity   Alcohol use: No   Drug use: No   Sexual activity: Yes    Partners: Male    Birth control/protection: I.U.D.    Comment: Paragard IUD  02-02-11  Other Topics Concern   Not on file  Social History Narrative   Not on file   Social Determinants of Health   Financial Resource Strain: Not on file  Food Insecurity: Not on file  Transportation Needs: Not on file  Physical Activity: Not on file  Stress: Not on file  Social Connections: Not on file  Intimate Partner Violence: Not on file    Review of Systems  All other systems reviewed and are  negative.  PHYSICAL EXAMINATION:    BP 130/82    Pulse 78    Ht 5' 0.25" (1.53 m)    Wt 188 lb (85.3 kg)    SpO2 98%    BMI 36.41 kg/m     General appearance: alert, cooperative and appears stated age  Pelvic: External genitalia:  no lesions              Urethra:  normal appearing urethra with no masses, tenderness or lesions              Bartholins and Skenes: normal                 Vagina: normal appearing vagina with normal color and discharge, no lesions              Cervix: no lesions                Bimanual Exam:  Uterus:  normal size, contour, position, consistency, mobility, non-tender              Adnexa: no mass, fullness, tenderness      IUD removal.  Consent for procedure.  Sterile prep with Hibiclens.  Paracervical block with 10 cc 1% lidocaine.  Lot [id number], exp 06/2022.  Tenaculum to anterior cervical lip.  Ring forceps used to remove IUD intact, declined to be seen by patient, and then discarded. Minimal EBL.  No complications.  Chaperone was present for exam:  Marchelle Folks, CMA  ASSESSMENT  Expired Paragard IUD.  Malpositioned IUD with partial penetration into the myometrium.  Small simple left ovarian cyst.   Amenorrhea.   PLAN  Post IUD removal precautions to patient.  Ovarian cyst discussed with patient.  No follow up is needed.  Will check FSH and estradiol today.  Fu for annual exam and prn.    An After Visit Summary was printed and given to the patient.

## 2021-04-23 ENCOUNTER — Encounter: Payer: Self-pay | Admitting: Obstetrics and Gynecology

## 2021-04-23 ENCOUNTER — Other Ambulatory Visit: Payer: Self-pay

## 2021-04-23 ENCOUNTER — Ambulatory Visit: Payer: BC Managed Care – PPO | Admitting: Obstetrics and Gynecology

## 2021-04-23 VITALS — BP 130/82 | HR 78 | Ht 60.25 in | Wt 188.0 lb

## 2021-04-23 DIAGNOSIS — N912 Amenorrhea, unspecified: Secondary | ICD-10-CM

## 2021-04-23 DIAGNOSIS — Z30432 Encounter for removal of intrauterine contraceptive device: Secondary | ICD-10-CM

## 2021-04-23 DIAGNOSIS — N83202 Unspecified ovarian cyst, left side: Secondary | ICD-10-CM

## 2021-04-23 LAB — ESTRADIOL: Estradiol: <15 pg/mL

## 2021-04-23 LAB — FOLLICLE STIMULATING HORMONE: FSH: 24.6 m[IU]/mL

## 2021-04-24 ENCOUNTER — Ambulatory Visit: Payer: Self-pay

## 2021-04-24 ENCOUNTER — Ambulatory Visit (INDEPENDENT_AMBULATORY_CARE_PROVIDER_SITE_OTHER): Payer: BC Managed Care – PPO | Admitting: Orthopedic Surgery

## 2021-04-24 DIAGNOSIS — M79644 Pain in right finger(s): Secondary | ICD-10-CM | POA: Diagnosis not present

## 2021-04-24 DIAGNOSIS — M7989 Other specified soft tissue disorders: Secondary | ICD-10-CM | POA: Diagnosis not present

## 2021-04-24 MED ORDER — METHYLPREDNISOLONE 4 MG PO TBPK: ORAL_TABLET | ORAL | 0 refills | Status: DC

## 2021-04-26 ENCOUNTER — Encounter: Payer: Self-pay | Admitting: Orthopedic Surgery

## 2021-04-26 NOTE — Progress Notes (Signed)
Office Visit Note   Patient: Leah Estrada           Date of Birth: 22-Jul-1970           MRN: [id number] Visit Date: 04/24/2021 Requested by: Girtha Rm, PA-C [place],  [place] [id number] PCP: Davy Pique  Subjective: Chief Complaint  Patient presents with   Right Hand - Pain    HPI: Willella is a 51 year old right-hand-dominant patient with right index finger swelling and pain for several months.  Denies any triggering.  Denies any history of injury to the right hand.  Also does not report any other orthopedic complaints involving the extremities.  She works as a Control and instrumentation engineer.  Has tried taking some over-the-counter medication without much relief.  Denies any fevers or chills.              ROS: All systems reviewed are negative as they relate to the chief complaint within the history of present illness.  Patient denies  fevers or chills.   Assessment & Plan: Visit Diagnoses:  1. Finger pain, right   2. Finger swelling     Plan: Impression is right index finger swelling which seems to be most prominent between the MCP and PIP joint on the volar aspect.  No [place] signs.  She does have limitation of flexion at that MCP joint.  Mild tenderness.  Ultrasound examination does confirm some diffuse soft tissue swelling within the soft tissues but no focal cyst or mass is present.  No erosion in the joints on plain radiographs.  Plan is blood work CBC DIF sed rate C-reactive protein ANA anti-CCP antibody rheumatoid factor and uric acid.  Also Medrol Dosepak 6-day course.  I will call her with those results and see how she is doing with the intervention.  May need further imaging of that finger if nothing really turns up on the results of treatment front or laboratory values front.  Follow-Up Instructions: Return if symptoms worsen or fail to improve.   Orders:  Orders Placed This Encounter  Procedures   XR Finger Index Right   CBC with Differential    Sed Rate (ESR)   Antinuclear Antib (ANA)   Rheumatoid Factor   C-reactive protein   Meds ordered this encounter  Medications   methylPREDNISolone (MEDROL DOSEPAK) 4 MG TBPK tablet    Sig: Take as directed on the box    Dispense:  21 tablet    Refill:  0      Procedures: No procedures performed   Clinical Data: No additional findings.  Objective: Vital Signs: There were no vitals taken for this visit.  Physical Exam:   Constitutional: Patient appears well-developed HEENT:  Head: Normocephalic Eyes:EOM are normal Neck: Normal range of motion Cardiovascular: Normal rate Pulmonary/chest: Effort normal Neurologic: Patient is alert Skin: Skin is warm Psychiatric: Patient has normal mood and affect   Ortho Exam: Ortho exam demonstrates intact perfusion and sensation in the right index finger.  She does have about 20 degrees less flexion at the MCP joint on the right MCP index compared to the left MCP index.  Collaterals are stable.  Extensor tendon tracks centrally.  Flexor tendon FDP and FDS intact left index finger.  No fluctuance erythema or warmth in the PIP or DIP or MCP joint.  No palmar tenderness.  Specialty Comments:  No specialty comments available.  Imaging: No results found.   PMFS History: Patient Active Problem List   Diagnosis Date  Noted   Ingrown toenail 08/16/2017   Onychomycosis 11/10/2016   Past Medical History:  Diagnosis Date   Abnormal Pap smear of cervix    2015, 2016 ASCUS HPV HR neg   COVID-19 virus infection 02/2019   Thyroid disease    hypothyroidism    Family History  Problem Relation Age of Onset   Breast cancer Mother    Heart disease Mother    Hyperlipidemia Mother    Pancreatic cancer Mother    Cancer Mother        pancreatic   Heart disease Father    Breast cancer Maternal Aunt    Heart disease Maternal Grandmother    Breast cancer Maternal Aunt     Past Surgical History:  Procedure Laterality Date   INTRAUTERINE  DEVICE INSERTION     inserted 02/02/11, removal 02/01/21   Social History   Occupational History   Not on file  Tobacco Use   Smoking status: Never   Smokeless tobacco: Never  Vaping Use   Vaping Use: Never used  Substance and Sexual Activity   Alcohol use: No   Drug use: No   Sexual activity: Yes    Partners: Male    Birth control/protection: I.U.D.    Comment: Paragard IUD  02-02-11

## 2021-04-27 LAB — CBC WITH DIFFERENTIAL/PLATELET
Absolute Monocytes: 624 cells/uL (ref 200–950)
Basophils Absolute: 49 cells/uL (ref 0–200)
Basophils Relative: 0.6 %
Eosinophils Absolute: 429 cells/uL (ref 15–500)
Eosinophils Relative: 5.3 %
HCT: 42.3 % (ref 35.0–45.0)
Hemoglobin: 13.9 g/dL (ref 11.7–15.5)
Lymphs Abs: 2309 cells/uL (ref 850–3900)
MCH: 28.6 pg (ref 27.0–33.0)
MCHC: 32.9 g/dL (ref 32.0–36.0)
MCV: 87 fL (ref 80.0–100.0)
MPV: 11.3 fL (ref 7.5–12.5)
Monocytes Relative: 7.7 %
Neutro Abs: 4690 cells/uL (ref 1500–7800)
Neutrophils Relative %: 57.9 %
Platelets: 250 10*3/uL (ref 140–400)
RBC: 4.86 10*6/uL (ref 3.80–5.10)
RDW: 13.4 % (ref 11.0–15.0)
Total Lymphocyte: 28.5 %
WBC: 8.1 10*3/uL (ref 3.8–10.8)

## 2021-04-27 LAB — SEDIMENTATION RATE: Sed Rate: 11 mm/h (ref 0–30)

## 2021-04-27 LAB — RHEUMATOID FACTOR: Rheumatoid fact SerPl-aCnc: <14 IU/mL (ref <14)

## 2021-04-27 LAB — C-REACTIVE PROTEIN: CRP: 2.4 mg/L (ref <8.0)

## 2021-04-27 LAB — ANA: Anti Nuclear Antibody (ANA): NEGATIVE

## 2021-04-28 NOTE — Progress Notes (Signed)
Please call.  Labs are normal.  Neck step in the work-up will be MRI scan of the finger.  With contrast can you see if she wants to get that done and we would likely have her follow-up with Dr. Frazier Butt after that.

## 2021-04-28 NOTE — Progress Notes (Signed)
LMOM for the patient of the below message from [facility] and told her to give [place] a call back if she would like [place] to order the MRI

## 2021-05-04 ENCOUNTER — Other Ambulatory Visit: Payer: Self-pay | Admitting: Nurse Practitioner

## 2021-05-04 NOTE — Telephone Encounter (Signed)
Opened in error

## 2021-05-06 ENCOUNTER — Other Ambulatory Visit: Payer: Self-pay

## 2021-05-06 ENCOUNTER — Other Ambulatory Visit: Payer: BC Managed Care – PPO

## 2021-05-06 DIAGNOSIS — D751 Secondary polycythemia: Secondary | ICD-10-CM

## 2021-05-06 DIAGNOSIS — E039 Hypothyroidism, unspecified: Secondary | ICD-10-CM

## 2021-05-06 DIAGNOSIS — R7989 Other specified abnormal findings of blood chemistry: Secondary | ICD-10-CM

## 2021-05-07 ENCOUNTER — Other Ambulatory Visit: Payer: Self-pay | Admitting: Nurse Practitioner

## 2021-05-07 ENCOUNTER — Other Ambulatory Visit: Payer: BC Managed Care – PPO

## 2021-05-07 DIAGNOSIS — E039 Hypothyroidism, unspecified: Secondary | ICD-10-CM

## 2021-05-07 LAB — CBC
HCT: 44.1 % (ref 35.0–45.0)
Hemoglobin: 14.3 g/dL (ref 11.7–15.5)
MCH: 28.8 pg (ref 27.0–33.0)
MCHC: 32.4 g/dL (ref 32.0–36.0)
MCV: 88.7 fL (ref 80.0–100.0)
MPV: 10.8 fL (ref 7.5–12.5)
Platelets: 235 10*3/uL (ref 140–400)
RBC: 4.97 10*6/uL (ref 3.80–5.10)
RDW: 13.6 % (ref 11.0–15.0)
WBC: 8.4 10*3/uL (ref 3.8–10.8)

## 2021-05-07 LAB — BASIC METABOLIC PANEL
BUN: 15 mg/dL (ref 7–25)
CO2: 30 mmol/L (ref 20–32)
Calcium: 9.4 mg/dL (ref 8.6–10.4)
Chloride: 104 mmol/L (ref 98–110)
Creat: 0.9 mg/dL (ref 0.50–1.03)
Glucose, Bld: 87 mg/dL (ref 65–99)
Potassium: 4.6 mmol/L (ref 3.5–5.3)
Sodium: 141 mmol/L (ref 135–146)

## 2021-05-07 LAB — TSH: TSH: 3.56 mIU/L

## 2021-05-07 MED ORDER — LEVOTHYROXINE SODIUM 75 MCG PO TABS: 75.0000 ug | ORAL_TABLET | Freq: Every day | ORAL | 2 refills | Status: DC

## 2021-05-20 ENCOUNTER — Telehealth: Payer: Self-pay

## 2021-05-20 NOTE — Telephone Encounter (Signed)
Okay for MRI scan and then we should have her follow-up with Sherilyn Cooter thanks

## 2021-05-20 NOTE — Telephone Encounter (Signed)
Ok for the following? Received this from patient via mychart requesting MRI    can you, please schedule a MRI on my finger, I was seen  in Jan. I was given meds , which meds took away 90% of swelling, but now the swelling has returned.

## 2021-05-22 ENCOUNTER — Encounter: Payer: Self-pay | Admitting: Physician Assistant

## 2021-05-22 ENCOUNTER — Other Ambulatory Visit: Payer: Self-pay

## 2021-05-22 ENCOUNTER — Telehealth: Payer: BC Managed Care – PPO | Admitting: Physician Assistant

## 2021-05-22 ENCOUNTER — Other Ambulatory Visit: Payer: Self-pay | Admitting: Nurse Practitioner

## 2021-05-22 VITALS — Ht 61.0 in | Wt 187.0 lb

## 2021-05-22 DIAGNOSIS — J3089 Other allergic rhinitis: Secondary | ICD-10-CM | POA: Insufficient documentation

## 2021-05-22 DIAGNOSIS — R058 Other specified cough: Secondary | ICD-10-CM

## 2021-05-22 DIAGNOSIS — E039 Hypothyroidism, unspecified: Secondary | ICD-10-CM

## 2021-05-22 DIAGNOSIS — M7989 Other specified soft tissue disorders: Secondary | ICD-10-CM

## 2021-05-22 DIAGNOSIS — M79644 Pain in right finger(s): Secondary | ICD-10-CM

## 2021-05-22 MED ORDER — AZITHROMYCIN 250 MG PO TABS
ORAL_TABLET | ORAL | 0 refills | Status: AC
Start: 2021-05-22 — End: 2021-05-27

## 2021-05-22 NOTE — Progress Notes (Signed)
°  Virtual Visit via Video Note   Patient ID: Leah Estrada, female    DOB: 19-Aug-1970, 51 y.o.   MRN: [id number]  I connected with above patient on 05/22/21 by a video enabled telemedicine application and verified that I am speaking with the correct person using two identifiers.  Location: Patient: home Provider: office   I discussed the limitations of evaluation and management by telemedicine and the availability of in person appointments. The patient expressed understanding and agreed to proceed.  History of Present Illness:  Chief Complaint  Patient presents with   other    Cough, congestion, back throat drainage, going down into chest, taking sinus and allergy medication sudafed sinus and allegra, started yesterday really bad,    Patient reports a 2 day history of cough productive of some yellow phlegm, post-nasal drip and is concerned that it is settling in her chest; denies itchy eyes, + mild watery eyes, + hoarse voice; denies runny nose or pressure in her face, denies sore throat; OTC Allegra, Flonase, Sudafed, and Mucinex are somewhat helpful; works as a Runner, broadcasting/film/video; denies known sick contacts; denies recent travel out of state or out of the country;  Denies fever / chills / nausea / vomiting / diarrhea / constipation.    Observations/Objective:  Ht 5\' 1"  (1.549 m)    Wt 187 lb (84.8 kg)    BMI 35.33 kg/m    Assessment: Encounter Diagnoses  Name Primary?   Non-seasonal allergic rhinitis, unspecified trigger Yes   Cough productive of purulent sputum      Plan:  Rest, increase clear fluids, OTC Tylenol (acetamenophen) for body aches, headaches, fever, chills as needed; continue OTC allergy medicine.   Crisol was seen today for other.  Diagnoses and all orders for this visit:  Non-seasonal allergic rhinitis, unspecified trigger  Cough productive of purulent sputum -     azithromycin (ZITHROMAX) 250 MG tablet; Take 2 tablets on day 1, then 1 tablet daily on days 2 through  5    Follow up: If your symptoms get worse, please go to Urgent Care or go to the Emergency Department for further evaluation in person.     I discussed the assessment and treatment plan with the patient. The patient was provided an opportunity to ask questions and all were answered. The patient agreed with the plan and demonstrated an understanding of the instructions.   The patient was advised to call back or seek an in-person evaluation if the symptoms worsen or if the condition fails to improve as anticipated.  I spent 15 minutes dedicated to the care of this patient, including pre-visit review of records, face to face time, post-visit ordering of testing and documentation.    Harriett Sine, PA-C

## 2021-05-22 NOTE — Telephone Encounter (Signed)
Scan ordered. Message sent to patient via mychart

## 2021-06-17 ENCOUNTER — Encounter: Payer: Self-pay | Admitting: *Deleted

## 2021-09-04 ENCOUNTER — Ambulatory Visit: Payer: BC Managed Care – PPO | Admitting: Podiatry

## 2021-11-04 ENCOUNTER — Encounter: Payer: Self-pay | Admitting: Nurse Practitioner

## 2021-11-06 ENCOUNTER — Encounter: Payer: Self-pay | Admitting: Nurse Practitioner

## 2022-02-09 ENCOUNTER — Other Ambulatory Visit: Payer: Self-pay | Admitting: Nurse Practitioner

## 2022-02-09 DIAGNOSIS — E039 Hypothyroidism, unspecified: Secondary | ICD-10-CM

## 2022-02-09 NOTE — Telephone Encounter (Signed)
Med refill request:Synthroid 75 mcg tab PO daily Last AEX: 03/26/21 /TW Next AEX: 04/12/22 /TW Last MMG (if hormonal med) N/A  Last RX sent 05/07/21 #90/ 2RF  Rx pended #90/0RF  Refill authorized: Please Advise?

## 2022-02-10 ENCOUNTER — Encounter: Payer: Self-pay | Admitting: Internal Medicine

## 2022-04-12 ENCOUNTER — Ambulatory Visit (INDEPENDENT_AMBULATORY_CARE_PROVIDER_SITE_OTHER): Payer: BC Managed Care – PPO | Admitting: Nurse Practitioner

## 2022-04-12 ENCOUNTER — Encounter: Payer: Self-pay | Admitting: Nurse Practitioner

## 2022-04-12 VITALS — BP 128/84 | HR 78 | Ht 61.0 in | Wt 175.0 lb

## 2022-04-12 DIAGNOSIS — B3731 Acute candidiasis of vulva and vagina: Secondary | ICD-10-CM | POA: Diagnosis not present

## 2022-04-12 DIAGNOSIS — Z01419 Encounter for gynecological examination (general) (routine) without abnormal findings: Secondary | ICD-10-CM

## 2022-04-12 DIAGNOSIS — R350 Frequency of micturition: Secondary | ICD-10-CM

## 2022-04-12 DIAGNOSIS — E039 Hypothyroidism, unspecified: Secondary | ICD-10-CM

## 2022-04-12 DIAGNOSIS — N949 Unspecified condition associated with female genital organs and menstrual cycle: Secondary | ICD-10-CM

## 2022-04-12 DIAGNOSIS — Z780 Asymptomatic menopausal state: Secondary | ICD-10-CM | POA: Diagnosis not present

## 2022-04-12 DIAGNOSIS — E7800 Pure hypercholesterolemia, unspecified: Secondary | ICD-10-CM

## 2022-04-12 LAB — URINALYSIS, COMPLETE W/RFL CULTURE
Bacteria, UA: NONE SEEN /HPF
Bilirubin Urine: NEGATIVE
Glucose, UA: NEGATIVE
Hgb urine dipstick: NEGATIVE
Hyaline Cast: NONE SEEN /LPF
Ketones, ur: NEGATIVE
Leukocyte Esterase: NEGATIVE
Nitrites, Initial: NEGATIVE
Protein, ur: NEGATIVE
RBC / HPF: NONE SEEN /HPF (ref 0–2)
Specific Gravity, Urine: 1.015 (ref 1.001–1.035)
WBC, UA: NONE SEEN /HPF (ref 0–5)
pH: 7 (ref 5.0–8.0)

## 2022-04-12 LAB — NO CULTURE INDICATED

## 2022-04-12 LAB — WET PREP FOR TRICH, YEAST, CLUE

## 2022-04-12 MED ORDER — FLUCONAZOLE 150 MG PO TABS: 150.0000 mg | ORAL_TABLET | ORAL | 0 refills | Status: DC

## 2022-04-12 NOTE — Progress Notes (Signed)
Leah Estrada 1970/04/07 [id number]   History:  52 y.o. G1P1001 presents for annual exam. Complains of intermittent vaginal discomfort without discharge or odor. She does have some urinary leakage and thinks it could be related. Paraguard removed 03/2021, no bleeding. LMP 02/2021. 2015/2016 ASCUS negative HPV with negative biopsy but showed severe acute and chronic cervicitis.  History of hypothyroidism.   Gynecologic History Patient's last menstrual period was 03/05/2021 (approximate).   Contraception/Family planning: post menopausal status Sexually active: Yes  Health Maintenance Last Pap: 10/17/2018. Results were: Normal neg HPV, 5-year repeat Last mammogram: 11/02/2021. Results were: Possible right breast mass, [place] 11/06/2021 normal Last colonoscopy: Never Last Dexa: Not indicated  Past medical history, past surgical history, family history and social history were all reviewed and documented in the EPIC chart. Married. TA for kindergarten special ed. 82 yo daughter, EMT, plans for paramedic. Mother and 2 maternal aunts with breast cancer history. Patient declines genetic screening.   ROS:  A ROS was performed and pertinent positives and negatives are included.  Exam:  Vitals:   04/12/22 0940  BP: 128/84  Pulse: 78  SpO2: 100%  Weight: 175 lb (79.4 kg)  Height: 5\' 1"  (1.549 m)    Body mass index is 33.07 kg/m.  General appearance:  Normal Thyroid:  Symmetrical, normal in size, without palpable masses or nodularity. Respiratory  Auscultation:  Clear without wheezing or rhonchi Cardiovascular  Auscultation:  Regular rate, without rubs, murmurs or gallops  Edema/varicosities:  Not grossly evident Abdominal  Soft,nontender, without masses, guarding or rebound.  Liver/spleen:  No organomegaly noted  Hernia:  None appreciated  Skin  Inspection:  Grossly normal Breasts: Examined lying and sitting.   Right: Without masses, retractions, nipple discharge or axillary  adenopathy.   Left: Without masses, retractions, nipple discharge or axillary adenopathy. Genitourinary   Inguinal/mons:  Normal without inguinal adenopathy  External genitalia:  Normal appearing vulva with no masses, tenderness, or lesions  BUS/Urethra/Skene's glands:  Normal  Vagina:  Normal appearing with normal color and discharge, no lesions  Cervix:  Normal appearing without discharge or lesions.  Uterus:  Normal in size, shape and contour.  Midline and mobile, nontender  Adnexa/parametria:     Rt: Normal in size, without masses or tenderness.   Lt: Normal in size, without masses or tenderness.  Anus and perineum: Normal  Digital rectal exam: Normal sphincter tone without palpated masses or tenderness  Patient informed chaperone available to be present for breast and pelvic exam. Patient has requested no chaperone to be present. Patient has been advised what will be completed during breast and pelvic exam.   Wet prep + yeast UA negative  Assessment/Plan:  52 y.o. G1P1001 for annual exam.   Well female exam with routine gynecological exam - Plan: CBC with Differential/Platelet, Comprehensive metabolic panel. Education provided on SBEs, importance of preventative screenings, current guidelines, high calcium diet, regular exercise, and multivitamin daily.   Postmenopausal - no HRT, no bleeding  Hypothyroidism, unspecified type - Plan: TSH  Elevated LDL cholesterol level - Plan: Lipid panel  Vaginal discomfort - Plan: WET PREP FOR TRICH, YEAST, CLUE. Wet prep positive for yeast.   Urinary frequency - Plan: Urinalysis,Complete w/RFL Culture. Negative UA.   Vaginal candidiasis - Plan: fluconazole (DIFLUCAN) 150 MG tablet today and repeat in 3 days for total of 2 doses.   Screening for cervical cancer - Normal Pap history.  Will repeat at 5-year interval per guidelines.  Screening for breast cancer - Normal mammogram history.  Continue annual screenings.  Normal breast exam  today.  Screening for colon cancer - Has not had screening colonoscopy. Wants to plan during school break this summer. Information provided on [facility].   Return in 1 year for annual.     Tamela Gammon DNP, 10:43 AM 04/12/2022

## 2022-04-13 ENCOUNTER — Other Ambulatory Visit: Payer: Self-pay | Admitting: Nurse Practitioner

## 2022-04-13 DIAGNOSIS — E039 Hypothyroidism, unspecified: Secondary | ICD-10-CM

## 2022-04-13 LAB — LIPID PANEL
Cholesterol: 160 mg/dL (ref <200)
HDL: 43 mg/dL — ABNORMAL LOW (ref >=50)
LDL Cholesterol (Calc): 96 mg/dL (calc)
Non-HDL Cholesterol (Calc): 117 mg/dL (calc) (ref <130)
Total CHOL/HDL Ratio: 3.7 (calc) (ref <5.0)
Triglycerides: 115 mg/dL (ref <150)

## 2022-04-13 LAB — COMPREHENSIVE METABOLIC PANEL
AG Ratio: 1.4 (calc) (ref 1.0–2.5)
ALT: 15 U/L (ref 6–29)
AST: 16 U/L (ref 10–35)
Albumin: 4.2 g/dL (ref 3.6–5.1)
Alkaline phosphatase (APISO): 131 U/L (ref 37–153)
BUN/Creatinine Ratio: 10 (calc) (ref 6–22)
BUN: 11 mg/dL (ref 7–25)
CO2: 29 mmol/L (ref 20–32)
Calcium: 9.5 mg/dL (ref 8.6–10.4)
Chloride: 105 mmol/L (ref 98–110)
Creat: 1.05 mg/dL — ABNORMAL HIGH (ref 0.50–1.03)
Globulin: 3 g/dL (calc) (ref 1.9–3.7)
Glucose, Bld: 76 mg/dL (ref 65–99)
Potassium: 4.3 mmol/L (ref 3.5–5.3)
Sodium: 140 mmol/L (ref 135–146)
Total Bilirubin: 0.7 mg/dL (ref 0.2–1.2)
Total Protein: 7.2 g/dL (ref 6.1–8.1)

## 2022-04-13 LAB — CBC WITH DIFFERENTIAL/PLATELET
Absolute Monocytes: 559 cells/uL (ref 200–950)
Basophils Absolute: 49 cells/uL (ref 0–200)
Basophils Relative: 0.6 %
Eosinophils Absolute: 389 cells/uL (ref 15–500)
Eosinophils Relative: 4.8 %
HCT: 42.5 % (ref 35.0–45.0)
Hemoglobin: 14 g/dL (ref 11.7–15.5)
Lymphs Abs: 2997 cells/uL (ref 850–3900)
MCH: 28.6 pg (ref 27.0–33.0)
MCHC: 32.9 g/dL (ref 32.0–36.0)
MCV: 86.9 fL (ref 80.0–100.0)
MPV: 10.9 fL (ref 7.5–12.5)
Monocytes Relative: 6.9 %
Neutro Abs: 4107 cells/uL (ref 1500–7800)
Neutrophils Relative %: 50.7 %
Platelets: 269 10*3/uL (ref 140–400)
RBC: 4.89 10*6/uL (ref 3.80–5.10)
RDW: 13 % (ref 11.0–15.0)
Total Lymphocyte: 37 %
WBC: 8.1 10*3/uL (ref 3.8–10.8)

## 2022-04-13 LAB — TSH: TSH: 2.54 mIU/L

## 2022-04-13 MED ORDER — LEVOTHYROXINE SODIUM 75 MCG PO TABS: 75.0000 ug | ORAL_TABLET | Freq: Every day | ORAL | 3 refills | Status: DC

## 2022-06-02 ENCOUNTER — Telehealth: Payer: BC Managed Care – PPO | Admitting: Medical

## 2022-06-02 VITALS — Temp 99.9°F | Wt 180.0 lb

## 2022-06-02 DIAGNOSIS — J988 Other specified respiratory disorders: Secondary | ICD-10-CM

## 2022-06-02 DIAGNOSIS — H9203 Otalgia, bilateral: Secondary | ICD-10-CM

## 2022-06-02 DIAGNOSIS — J3489 Other specified disorders of nose and nasal sinuses: Secondary | ICD-10-CM

## 2022-06-02 DIAGNOSIS — R062 Wheezing: Secondary | ICD-10-CM

## 2022-06-02 MED ORDER — HYDROCODONE BIT-HOMATROP MBR 5-1.5 MG/5ML PO SOLN: 5.0000 mL | Freq: Three times a day (TID) | ORAL | 0 refills | Status: AC | PRN

## 2022-06-02 MED ORDER — AMOXICILLIN 875 MG PO TABS: 875.0000 mg | ORAL_TABLET | Freq: Two times a day (BID) | ORAL | 0 refills | Status: AC

## 2022-06-02 NOTE — Progress Notes (Signed)
Subjective:     Patient ID: Leah Estrada, female   DOB: 18-May-1970, 52 y.o.   MRN: [id number]  This visit type was conducted due to national recommendations for restrictions regarding the COVID-19 Pandemic (e.g. social distancing) in an effort to limit this patient's exposure and mitigate transmission in our community.  Due to their co-morbid illnesses, this patient is at least at moderate risk for complications without adequate follow up.  This format is felt to be most appropriate for this patient at this time.    Documentation for virtual audio and video telecommunications through [facility] encounter:  The patient was located at home. The provider was located in the office. The patient did consent to this visit and is aware of possible charges through their insurance for this visit.  The other persons participating in this telemedicine service were none. Time spent on call was 20 minutes and in review of previous records 20 minutes total.  This virtual service is not related to other E/M service within previous 7 days.   HPI Chief Complaint  Patient presents with   cough and wheezing    Cough, pressure in ears, achy, pressure around eyes, wheezing/rattle in chest symptoms started Saturday but gotten worse. Taken otc meds, covid- neg   Virtual for illness.  X 4 days.  Has had cough, post nasal draiange.  Worse today, wheezing, rattly in chest.   Been using sudafed.   Feels worse in head and ears now.  Has pressure in sinus and around eyes.   Coughing quite a bit.  No sore throat.  No NVD.   Works with kindergartners.  Did a home covid test this morning that was negative.  No hx/o asthma or lung disease.  Nonsmoker.  Using [employer] brand cold and chest medication.  No productive mucous.  No other aggravating or relieving factors. No other complaint.  Past Medical History:  Diagnosis Date   Abnormal Pap smear of cervix    2015, 2016 ASCUS HPV HR neg   COVID-19 virus infection 02/2019    Thyroid disease    hypothyroidism   Current Outpatient Medications on File Prior to Visit  Medication Sig Dispense Refill   Ascorbic Acid (VITAMIN C) 1000 MG tablet Take 1,000 mg by mouth daily.     Cholecalciferol (VITAMIN D3) 50 MCG (2000 UT) TABS Take 1 tablet by mouth daily.     levothyroxine (SYNTHROID) 75 MCG tablet Take 1 tablet (75 mcg total) by mouth daily before breakfast. 90 tablet 3   Zinc 50 MG TABS Take by mouth.     Fexofenadine HCl (ALLEGRA PO) Take by mouth daily. (Patient not taking: Reported on 06/02/2022)     No current facility-administered medications on file prior to visit.    Review of Systems As in subjective    Objective:   Physical Exam Due to coronavirus pandemic stay at home measures, patient visit was virtual and they were not examined in person.   Temp 99.9 F (37.7 C)   Wt 180 lb (81.6 kg)   LMP 03/05/2021 (Approximate) Comment: Irregular  BMI 34.01 kg/m   Gen: wd, wn, nad Hoarse and congested sounding No obvious wheezing or labored breathing      Assessment:     Encounter Diagnoses  Name Primary?   Sinus pressure Yes   Wheeze    Respiratory tract infection    Otalgia of both ears         Plan:     Advised rest, Estrada hydration,  can continue the other counter remedies she is using except less stronger cough syrup below at night or up to twice a day.  Do not use this at the exact same time as the over-the-counter remedy, spread these out by at least 2 or 3 hours, can use salt or gargles, nasal saline flushes discussed.  Begin amoxicillin as below.  We discussed her allergies and potential risk.  If worse or not improving over the next few days or if new symptoms then call back  Zahira was seen today for cough and wheezing.  Diagnoses and all orders for this visit:  Sinus pressure  Wheeze  Respiratory tract infection  Otalgia of both ears  Other orders -     HYDROcodone bit-homatropine (HYCODAN) 5-1.5 MG/5ML syrup; Take 5 mLs  by mouth every 8 (eight) hours as needed for up to 5 days for cough. -     amoxicillin (AMOXIL) 875 MG tablet; Take 1 tablet (875 mg total) by mouth 2 (two) times daily for 10 days.  Fu prn

## 2022-08-16 ENCOUNTER — Encounter: Payer: Self-pay | Admitting: Internal Medicine

## 2022-08-31 ENCOUNTER — Ambulatory Visit (AMBULATORY_SURGERY_CENTER): Payer: BC Managed Care – PPO

## 2022-08-31 ENCOUNTER — Telehealth: Payer: Self-pay

## 2022-08-31 VITALS — Ht 61.0 in | Wt 185.0 lb

## 2022-08-31 DIAGNOSIS — Z1211 Encounter for screening for malignant neoplasm of colon: Secondary | ICD-10-CM

## 2022-08-31 MED ORDER — NA SULFATE-K SULFATE-MG SULF 17.5-3.13-1.6 GM/177ML PO SOLN
1.0000 | Freq: Once | ORAL | 0 refills | Status: AC
Start: 2022-08-31 — End: 2022-08-31

## 2022-08-31 NOTE — Telephone Encounter (Signed)
PV completed.  °

## 2022-08-31 NOTE — Progress Notes (Signed)
No egg or soy allergy known to patient  No issues known to pt with past sedation with any surgeries or procedures Patient denies ever being told they had issues or difficulty with intubation  No FH of Malignant Hyperthermia Pt is not on diet pills Pt is not on  home 02  Pt is not on blood thinners  Pt reports occ constipation instructions given for extra miralax  No A fib or A flutter Have any cardiac testing pending--no  Pt is ambulatory  Patient's chart reviewed by Cathlyn Parsons CNRA prior to previsit and patient appropriate for the [facility].  Previsit completed and red dot placed by patient's name on their procedure day (on provider's schedule).     PV completed with patient. Prep instructions reviewed and sent via mychart and to home address. Goodrx coupon for [facility] provided,  Pt instructed to use Singlecare.com or GoodRx for a price reduction on prep

## 2022-09-08 ENCOUNTER — Encounter: Payer: Self-pay | Admitting: Internal Medicine

## 2022-09-16 ENCOUNTER — Encounter: Payer: Self-pay | Admitting: Certified Registered Nurse Anesthetist

## 2022-09-22 ENCOUNTER — Encounter: Payer: Self-pay | Admitting: Internal Medicine

## 2022-09-22 ENCOUNTER — Ambulatory Visit (AMBULATORY_SURGERY_CENTER): Payer: BC Managed Care – PPO | Admitting: Internal Medicine

## 2022-09-22 VITALS — BP 123/78 | HR 71 | Temp 97.8°F | Resp 19 | Ht 61.0 in | Wt 185.0 lb

## 2022-09-22 DIAGNOSIS — Z1211 Encounter for screening for malignant neoplasm of colon: Secondary | ICD-10-CM | POA: Diagnosis present

## 2022-09-22 LAB — HM COLONOSCOPY

## 2022-09-22 MED ORDER — SODIUM CHLORIDE 0.9 % IV SOLN
500.0000 mL | Freq: Once | INTRAVENOUS | Status: DC
Start: 2022-09-22 — End: 2022-09-22

## 2022-09-22 NOTE — Progress Notes (Signed)
[facility] History and Physical   Primary Care Physician:  Medicine, [facility]   Reason for Procedure:   CRCA screening  Plan:    colonoscopy     HPI: Leah Estrada is a 52 y.o. female here for screening colonoscopy exam   Past Medical History:  Diagnosis Date   Abnormal Pap smear of cervix    2015, 2016 ASCUS HPV HR neg   COVID-19 virus infection 02/2019   Thyroid disease    hypothyroidism    Past Surgical History:  Procedure Laterality Date   INTRAUTERINE DEVICE INSERTION     inserted 02/02/11, removal 02/01/21    Prior to Admission medications   Medication Sig Start Date End Date Taking? Authorizing Provider  Ascorbic Acid (VITAMIN C) 1000 MG tablet Take 1,000 mg by mouth daily.   Yes [provider]  Cholecalciferol (VITAMIN D3) 50 MCG (2000 UT) TABS Take 1 tablet by mouth daily.   Yes [provider]  Cyanocobalamin (VITAMIN B 12 PO) Take by mouth. Takes sometimes   Yes [provider]  levothyroxine (SYNTHROID) 75 MCG tablet Take 1 tablet (75 mcg total) by mouth daily before breakfast. 04/13/22  Yes Wyline Beady A, NP  Zinc 50 MG TABS Take by mouth.   Yes [provider]  Fexofenadine HCl (ALLEGRA PO) Take by mouth as needed.    [provider]    Current Outpatient Medications  Medication Sig Dispense Refill   Ascorbic Acid (VITAMIN C) 1000 MG tablet Take 1,000 mg by mouth daily.     Cholecalciferol (VITAMIN D3) 50 MCG (2000 UT) TABS Take 1 tablet by mouth daily.     Cyanocobalamin (VITAMIN B 12 PO) Take by mouth. Takes sometimes     levothyroxine (SYNTHROID) 75 MCG tablet Take 1 tablet (75 mcg total) by mouth daily before breakfast. 90 tablet 3   Zinc 50 MG TABS Take by mouth.     Fexofenadine HCl (ALLEGRA PO) Take by mouth as needed.     Current Facility-Administered Medications  Medication Dose Route Frequency Provider Last Rate Last Admin   0.9 %  sodium chloride infusion  500 mL  Intravenous Once Iva Boop, MD        Allergies as of 09/22/2022 - Review Complete 09/22/2022  Allergen Reaction Noted   Cephalexin Hives 04/10/2018   Flagyl [metronidazole] Hives 09/11/2012    Family History  Problem Relation Age of Onset   Breast cancer Mother    Heart disease Mother    Hyperlipidemia Mother    Pancreatic cancer Mother    Cancer Mother        pancreatic   Heart disease Father    Breast cancer Maternal Aunt    Breast cancer Maternal Aunt    Heart disease Maternal Grandmother    Colon cancer Neg Hx    Rectal cancer Neg Hx    Stomach cancer Neg Hx     Social History   Socioeconomic History   Marital status: Married    Spouse name: Not on file   Number of children: Not on file   Years of education: Not on file   Highest education level: Not on file  Occupational History   Not on file  Tobacco Use   Smoking status: Never   Smokeless tobacco: Never  Vaping Use   Vaping Use: Never used  Substance and Sexual Activity   Alcohol use: No   Drug use: No   Sexual activity: Yes  Partners: Male    Birth control/protection: I.U.D.    Comment: Paragard IUD  02-02-11  Other Topics Concern   Not on file  Social History Narrative   Not on file   Social Determinants of Health   Financial Resource Strain: Not on file  Food Insecurity: Not on file  Transportation Needs: Not on file  Physical Activity: Not on file  Stress: Not on file  Social Connections: Not on file  Intimate Partner Violence: Not on file    Review of Systems:  All other review of systems negative except as mentioned in the HPI.  Physical Exam: Vital signs BP (!) 134/99   Pulse 80   Temp 97.8 F (36.6 C)   Ht 5\' 1"  (1.549 m)   Wt 185 lb (83.9 kg)   LMP 03/05/2021 (Approximate) Comment: Irregular  SpO2 100%   BMI 34.96 kg/m   General:   Alert,  Well-developed, well-nourished, pleasant and cooperative in NAD Lungs:  Clear throughout to auscultation.   Heart:  Regular  rate and rhythm; no murmurs, clicks, rubs,  or gallops. Abdomen:  Soft, nontender and nondistended. Normal bowel sounds.   Neuro/Psych:  Alert and cooperative. Normal mood and affect. A and O x 3   @Manali Mcelmurry  Sena Slate, MD, [facility] [phone] (pager) 09/22/2022 10:19 AM@

## 2022-09-22 NOTE — Progress Notes (Signed)
Pt's states no medical or surgical changes since previsit or office visit. 

## 2022-09-22 NOTE — Patient Instructions (Addendum)
No polyps and no cancer seen.  You do have diverticulosis - thickened muscle rings and pouches in the colon wall. Please read the handout about this condition.  Next routine colonoscopy or other screening test in 10 years - 2034.  I appreciate the opportunity to care for you. Iva Boop, MD, FACG   YOU HAD AN ENDOSCOPIC PROCEDURE TODAY AT THE [facility]:   Refer to the procedure report that was given to you for any specific questions about what was found during the examination.  If the procedure report does not answer your questions, please call your gastroenterologist to clarify.  If you requested that your care partner not be given the details of your procedure findings, then the procedure report has been included in a sealed envelope for you to review at your convenience later.  YOU SHOULD EXPECT: Some feelings of bloating in the abdomen. Passage of more gas than usual.  Walking can help get rid of the air that was put into your GI tract during the procedure and reduce the bloating. If you had a lower endoscopy (such as a colonoscopy or flexible sigmoidoscopy) you may notice spotting of blood in your stool or on the toilet paper. If you underwent a bowel prep for your procedure, you may not have a normal bowel movement for a few days.  Please Note:  You might notice some irritation and congestion in your nose or some drainage.  This is from the oxygen used during your procedure.  There is no need for concern and it should clear up in a day or so.  SYMPTOMS TO REPORT IMMEDIATELY:  Following lower endoscopy (colonoscopy or flexible sigmoidoscopy):  Excessive amounts of blood in the stool  Significant tenderness or worsening of abdominal pains  Swelling of the abdomen that is new, acute  Fever of 100F or higher  For urgent or emergent issues, a gastroenterologist can be reached at any hour by calling [phone]. Do not use MyChart messaging for urgent concerns.     DIET:  We do recommend a small meal at first, but then you may proceed to your regular diet.  Drink plenty of fluids but you should avoid alcoholic beverages for 24 hours.  ACTIVITY:  You should plan to take it easy for the rest of today and you should NOT DRIVE or use heavy machinery until tomorrow (because of the sedation medicines used during the test).    FOLLOW UP: Our staff will call the number listed on your records the next business day following your procedure.  We will call around 7:15- 8:00 am to check on you and address any questions or concerns that you may have regarding the information given to you following your procedure. If we do not reach you, we will leave a message.     If any biopsies were taken you will be contacted by phone or by letter within the next 1-3 weeks.  Please call us at [phone] if you have not heard about the biopsies in 3 weeks.    SIGNATURES/CONFIDENTIALITY: You and/or your care partner have signed paperwork which will be entered into your electronic medical record.  These signatures attest to the fact that that the information above on your After Visit Summary has been reviewed and is understood.  Full responsibility of the confidentiality of this discharge information lies with you and/or your care-partner.

## 2022-09-22 NOTE — Op Note (Signed)
[facility] Patient Name: Leah Estrada Procedure Date: 09/22/2022 10:12 AM MRN: [id number] Endoscopist: Iva Boop , MD, [id number] Age: 52 Referring MD:  Date of Birth: [birth year] Gender: Female Account #: [id number] Procedure:                Colonoscopy Indications:              Screening for colorectal malignant neoplasm, This                            is the patient's first colonoscopy Medicines:                Monitored Anesthesia Care Procedure:                Pre-Anesthesia Assessment:                           - Prior to the procedure, a History and Physical                            was performed, and patient medications and                            allergies were reviewed. The patient's tolerance of                            previous anesthesia was also reviewed. The risks                            and benefits of the procedure and the sedation                            options and risks were discussed with the patient.                            All questions were answered, and informed consent                            was obtained. Prior Anticoagulants: The patient has                            taken no anticoagulant or antiplatelet agents. ASA                            Grade Assessment: II - A patient with mild systemic                            disease. After reviewing the risks and benefits,                            the patient was deemed in satisfactory condition to                            undergo the procedure.  After obtaining informed consent, the colonoscope                            was passed under direct vision. Throughout the                            procedure, the patient's blood pressure, pulse, and                            oxygen saturations were monitored continuously. The                            Olympus PCF-[id number]) Colonoscope was                            introduced through the  anus and advanced to the the                            cecum, identified by appendiceal orifice and                            ileocecal valve. The colonoscopy was performed                            without difficulty. The patient tolerated the                            procedure well. The quality of the bowel                            preparation was good. The ileocecal valve,                            appendiceal orifice, and rectum were photographed.                            The bowel preparation used was SUPREP via split                            dose instruction. Scope In: 10:29:29 AM Scope Out: 10:43:02 AM Scope Withdrawal Time: 0 hours 11 minutes 38 seconds  Total Procedure Duration: 0 hours 13 minutes 33 seconds  Findings:                 The perianal and digital rectal examinations were                            normal.                           Multiple small-mouthed diverticula were found in                            the sigmoid colon.  The exam was otherwise without abnormality on                            direct and retroflexion views. Complications:            No immediate complications. Estimated Blood Loss:     Estimated blood loss: none. Impression:               - Diverticulosis in the sigmoid colon.                           - The examination was otherwise normal on direct                            and retroflexion views.                           - No specimens collected. Recommendation:           - Patient has a contact number available for                            emergencies. The signs and symptoms of potential                            delayed complications were discussed with the                            patient. Return to normal activities tomorrow.                            Written discharge instructions were provided to the                            patient.                           - Resume previous diet.                            - Continue present medications.                           - Repeat colonoscopy in 10 years for screening                            purposes. Iva Boop, MD 09/22/2022 10:50:17 AM This report has been signed electronically.

## 2022-09-22 NOTE — Progress Notes (Signed)
Report given to PACU, vss 

## 2022-09-23 ENCOUNTER — Telehealth: Payer: Self-pay

## 2022-09-23 NOTE — Telephone Encounter (Signed)
  Follow up Call-     09/22/2022    9:21 AM  Call back number  Post procedure Call Back phone  # [phone]  Permission to leave phone message Yes     Patient questions:  Do you have a fever, pain , or abdominal swelling? No. Pain Score  0 *  Have you tolerated food without any problems? Yes.    Have you been able to return to your normal activities? Yes.    Do you have any questions about your discharge instructions: Diet   No. Medications  No. Follow up visit  No.  Do you have questions or concerns about your Care? No.  Actions: * If pain score is 4 or above: No action needed, pain <4.

## 2022-09-29 ENCOUNTER — Encounter: Payer: Self-pay | Admitting: Internal Medicine

## 2022-10-07 ENCOUNTER — Encounter: Payer: Self-pay | Admitting: Internal Medicine

## 2022-11-08 ENCOUNTER — Encounter: Payer: Self-pay | Admitting: Nurse Practitioner

## 2023-03-28 ENCOUNTER — Telehealth: Payer: Self-pay | Admitting: *Deleted

## 2023-03-28 NOTE — Telephone Encounter (Signed)
Patient left message requesting return call regarding blood work, no additional information.   Left message to call [facility] Triage at [phone], option 4.

## 2023-03-28 NOTE — Telephone Encounter (Signed)
Does not have to be fasting for labs at her annual visit. Lipid panel normal (other than low HDL) last year.

## 2023-03-28 NOTE — Telephone Encounter (Signed)
Pt returned call.   Pt called to inquire if she needed to be fasting her AEX BW to be done?   Per EMR investigation: AEX scheduled for 04/14/2023 & last labs done in 2024 were CBC, CMP, TSH, & lipid panel.  States that she could only schedule AEX for this day but at this time @ 3pm since usually has to be at work before we open.   States if fasting is required, then she probably wouldn't be able to have done until after AEX appt around 04/17/2022.   Please advise.

## 2023-03-29 NOTE — Telephone Encounter (Signed)
LDVM on machine per DPR. Encounter closed.

## 2023-04-14 ENCOUNTER — Other Ambulatory Visit (HOSPITAL_COMMUNITY)
Admission: RE | Admit: 2023-04-14 | Discharge: 2023-04-14 | Disposition: A | Discharge status: Home / Self Care | Payer: 59 | Source: Ambulatory Visit | Attending: Nurse Practitioner | Admitting: Nurse Practitioner

## 2023-04-14 ENCOUNTER — Ambulatory Visit: Payer: 59 | Admitting: Nurse Practitioner

## 2023-04-14 ENCOUNTER — Encounter: Payer: Self-pay | Admitting: Nurse Practitioner

## 2023-04-14 VITALS — BP 132/88 | HR 78 | Ht 60.25 in | Wt 193.0 lb

## 2023-04-14 DIAGNOSIS — Z13220 Encounter for screening for lipoid disorders: Secondary | ICD-10-CM

## 2023-04-14 DIAGNOSIS — N94818 Other vulvodynia: Secondary | ICD-10-CM | POA: Diagnosis not present

## 2023-04-14 DIAGNOSIS — Z780 Asymptomatic menopausal state: Secondary | ICD-10-CM

## 2023-04-14 DIAGNOSIS — E039 Hypothyroidism, unspecified: Secondary | ICD-10-CM

## 2023-04-14 DIAGNOSIS — Z124 Encounter for screening for malignant neoplasm of cervix: Secondary | ICD-10-CM | POA: Diagnosis present

## 2023-04-14 DIAGNOSIS — Z01419 Encounter for gynecological examination (general) (routine) without abnormal findings: Secondary | ICD-10-CM | POA: Diagnosis not present

## 2023-04-14 LAB — WET PREP FOR TRICH, YEAST, CLUE

## 2023-04-14 NOTE — Progress Notes (Signed)
Leah Estrada 04/16/1970 [id number]   History:  53 y.o. G1P1001 presents for annual exam. Postmenopausal - no HRT. 2015/2016 ASCUS negative HPV with negative biopsy but showed severe acute and chronic cervicitis.  History of hypothyroidism. Would like to be checked for BV.   Gynecologic History Patient's last menstrual period was 03/05/2021 (approximate).   Contraception/Family planning: post menopausal status Sexually active: Yes  Health Maintenance Last Pap: 10/17/2018. Results were: Normal neg HPV Last mammogram: 11/04/2022. Results were: Normal Last colonoscopy: 09/22/2022. Results were: Normal Last Dexa: Not indicated  Past medical history, past surgical history, family history and social history were all reviewed and documented in the EPIC chart. Married. TA for kindergarten special ed. 18 yo daughter, EMT, plans for paramedic. Mother and 2 maternal aunts with breast cancer history. Patient declines genetic screening.   ROS:  A ROS was performed and pertinent positives and negatives are included.  Exam:  Vitals:   04/14/23 1509  BP: 132/88  Pulse: 78  SpO2: 100%  Weight: 193 lb (87.5 kg)  Height: 5' 0.25" (1.53 m)     Body mass index is 37.38 kg/m.  General appearance:  Normal Thyroid:  Symmetrical, normal in size, without palpable masses or nodularity. Respiratory  Auscultation:  Clear without wheezing or rhonchi Cardiovascular  Auscultation:  Regular rate, without rubs, murmurs or gallops  Edema/varicosities:  Not grossly evident Abdominal  Soft,nontender, without masses, guarding or rebound.  Liver/spleen:  No organomegaly noted  Hernia:  None appreciated  Skin  Inspection:  Grossly normal Breasts: Examined lying and sitting.   Right: Without masses, retractions, nipple discharge or axillary adenopathy.   Left: Without masses, retractions, nipple discharge or axillary adenopathy. Pelvic: External genitalia:  no lesions              Urethra:  normal  appearing urethra with no masses, tenderness or lesions              Bartholins and Skenes: normal                 Vagina: normal appearing vagina with normal color and discharge, no lesions              Cervix: no lesions Bimanual Exam:  Uterus:  no masses or tenderness              Adnexa: no mass, fullness, tenderness              Rectovaginal: Deferred              Anus:  normal, no lesions  Patient informed chaperone available to be present for breast and pelvic exam. Patient has requested no chaperone to be present. Patient has been advised what will be completed during breast and pelvic exam.   Wet prep negative  Assessment/Plan:  53 y.o. G1P1001 for annual exam.   Well female exam with routine gynecological exam - Plan: CBC with Differential/Platelet, Comprehensive metabolic panel. Education provided on SBEs, importance of preventative screenings, current guidelines, high calcium diet, regular exercise, and multivitamin daily.   Postmenopausal - no HRT, no bleeding  Hypothyroidism, unspecified type - Plan: TSH  Elevated LDL cholesterol level - Plan: Lipid panel  Vulvar discomfort - Plan: WET PREP FOR TRICH, YEAST, CLUE. Negative wet prep and exam.   Screening for cervical cancer - Plan: Cytology - PAP( [facility])  Screening for breast cancer - Normal mammogram history.  Continue annual screenings.  Normal breast exam today.  Screening for colon cancer -  08/2022 colonoscopy. Will repeat at GI's recommended interval.   Return in about 1 year (around 04/13/2024) for Annual.    [name] DNP, 3:13 PM 04/14/2023

## 2023-04-15 LAB — CBC WITH DIFFERENTIAL/PLATELET
Absolute Lymphocytes: 3333 {cells}/uL (ref 850–3900)
Absolute Monocytes: 717 {cells}/uL (ref 200–950)
Basophils Absolute: 40 {cells}/uL (ref 0–200)
Basophils Relative: 0.4 %
Eosinophils Absolute: 465 {cells}/uL (ref 15–500)
Eosinophils Relative: 4.6 %
HCT: 40.4 % (ref 35.0–45.0)
Hemoglobin: 13.3 g/dL (ref 11.7–15.5)
MCH: 28.7 pg (ref 27.0–33.0)
MCHC: 32.9 g/dL (ref 32.0–36.0)
MCV: 87.1 fL (ref 80.0–100.0)
MPV: 10.9 fL (ref 7.5–12.5)
Monocytes Relative: 7.1 %
Neutro Abs: 5545 {cells}/uL (ref 1500–7800)
Neutrophils Relative %: 54.9 %
Platelets: 262 10*3/uL (ref 140–400)
RBC: 4.64 10*6/uL (ref 3.80–5.10)
RDW: 13.5 % (ref 11.0–15.0)
Total Lymphocyte: 33 %
WBC: 10.1 10*3/uL (ref 3.8–10.8)

## 2023-04-15 LAB — COMPREHENSIVE METABOLIC PANEL
AG Ratio: 1.6 (calc) (ref 1.0–2.5)
ALT: 26 U/L (ref 6–29)
AST: 20 U/L (ref 10–35)
Albumin: 4.3 g/dL (ref 3.6–5.1)
Alkaline phosphatase (APISO): 132 U/L (ref 37–153)
BUN: 17 mg/dL (ref 7–25)
CO2: 24 mmol/L (ref 20–32)
Calcium: 9.5 mg/dL (ref 8.6–10.4)
Chloride: 105 mmol/L (ref 98–110)
Creat: 1.02 mg/dL (ref 0.50–1.03)
Globulin: 2.7 g/dL (ref 1.9–3.7)
Glucose, Bld: 76 mg/dL (ref 65–99)
Potassium: 4 mmol/L (ref 3.5–5.3)
Sodium: 140 mmol/L (ref 135–146)
Total Bilirubin: 0.5 mg/dL (ref 0.2–1.2)
Total Protein: 7 g/dL (ref 6.1–8.1)

## 2023-04-15 LAB — LIPID PANEL
Cholesterol: 157 mg/dL (ref <200)
HDL: 37 mg/dL — ABNORMAL LOW (ref >=50)
LDL Cholesterol (Calc): 94 mg/dL
Non-HDL Cholesterol (Calc): 120 mg/dL (ref <130)
Total CHOL/HDL Ratio: 4.2 (calc) (ref <5.0)
Triglycerides: 161 mg/dL — ABNORMAL HIGH (ref <150)

## 2023-04-15 LAB — CYTOLOGY - PAP
Comment: NEGATIVE
Diagnosis: NEGATIVE
High risk HPV: NEGATIVE

## 2023-04-15 LAB — TSH: TSH: 3.58 m[IU]/L

## 2023-04-19 ENCOUNTER — Other Ambulatory Visit: Payer: Self-pay | Admitting: Nurse Practitioner

## 2023-04-19 ENCOUNTER — Encounter: Payer: Self-pay | Admitting: Nurse Practitioner

## 2023-04-19 DIAGNOSIS — E039 Hypothyroidism, unspecified: Secondary | ICD-10-CM

## 2023-04-19 MED ORDER — LEVOTHYROXINE SODIUM 75 MCG PO TABS: 75.0000 ug | ORAL_TABLET | Freq: Every day | ORAL | 3 refills | Status: DC

## 2023-11-07 LAB — HM MAMMOGRAPHY

## 2024-03-21 ENCOUNTER — Encounter: Payer: Self-pay | Admitting: Family Medicine

## 2024-03-21 ENCOUNTER — Telehealth: Payer: Self-pay

## 2024-03-21 ENCOUNTER — Ambulatory Visit: Admitting: Family Medicine

## 2024-03-21 ENCOUNTER — Ambulatory Visit: Payer: Self-pay

## 2024-03-21 VITALS — BP 128/78 | HR 74 | Wt 191.8 lb

## 2024-03-21 DIAGNOSIS — J3089 Other allergic rhinitis: Secondary | ICD-10-CM | POA: Diagnosis not present

## 2024-03-21 DIAGNOSIS — M25461 Effusion, right knee: Secondary | ICD-10-CM | POA: Diagnosis not present

## 2024-03-21 DIAGNOSIS — M25462 Effusion, left knee: Secondary | ICD-10-CM | POA: Diagnosis not present

## 2024-03-21 MED ORDER — METHYLPREDNISOLONE 4 MG PO TBPK: ORAL_TABLET | ORAL | 0 refills | Status: DC

## 2024-03-21 NOTE — Telephone Encounter (Signed)
 Dr. Vita,  [facility] is closed, Amour called to let you know that. I told her she could go Friday. But I just wanted to let you know.

## 2024-03-21 NOTE — Progress Notes (Signed)
 "  Name: Leah Estrada   Date of Visit: 03/21/2024   Date of last visit with me: Visit date not found   CHIEF COMPLAINT:  Chief Complaint  Patient presents with   Acute Visit    First notice knee was tender to touch on left knee, stiffness in knee, swollen, now is in right knee, been taking Advil for pain, hands have been swelling,        HPI:  Discussed the use of AI scribe software for clinical note transcription with the patient, who gave verbal consent to proceed.  History of Present Illness   Leah Estrada is a 53 year old female who presents with swelling in the knees and hands.  She experiences swelling primarily in the knees, with the right knee more affected than the left. The swelling began without any known injury and has been present for an unspecified duration. It is accompanied by stiffness, particularly noticeable when sitting down, using the restroom, or climbing stairs. There is tenderness in one specific spot on the knee, but no significant pain otherwise. She has been using ice and a TENS unit intermittently to manage the symptoms.  She also notes swelling in her right hand. She has been taking Advil, which has helped reduce stiffness, but the swelling persists. She mentions that her lip appeared swollen this morning, although this is not a consistent symptom.  No significant swelling below the knees and no current itchiness, although she experienced some itchiness in the past localized to the knee area. She is currently taking Allegra daily for allergies.  She stands on her feet a lot, which she believes may contribute to the swelling.         OBJECTIVE:       04/14/2023    3:10 PM  Depression screen PHQ 2/9  Decreased Interest 0  Down, Depressed, Hopeless 0  PHQ - 2 Score 0     BP Readings from Last 3 Encounters:  03/21/24 128/78  04/14/23 132/88  09/22/22 123/78    BP 128/78   Pulse 74   Wt 191 lb 12.8 oz (87 kg)   LMP 03/05/2021 Comment:  Irregular  SpO2 98%   BMI 37.15 kg/m    Physical Exam   MUSCULOSKELETAL: Right knee swelling.      Physical Exam Constitutional:      Appearance: Normal appearance.  Neurological:     General: No focal deficit present.     Mental Status: She is alert and oriented to person, place, and time. Mental status is at baseline.   No tenderness to palpation over the medial lateral joint lines.  Mild swelling of the lower extremities with +1 pitting edema.  No significant swelling of the upper extremities or hands.  No pain with flexion or extension of the bilateral knees. ASSESSMENT/PLAN:   Assessment & Plan Bilateral knee swelling  Non-seasonal allergic rhinitis, unspecified trigger    Assessment and Plan    Bilateral knee swelling and lower extremity edema, likely multifactorial (venous insufficiency, inflammation, and possible allergic component) Swelling primarily in knees, more in right knee. Differential includes arthritis, venous insufficiency, allergic reaction. Venous insufficiency suspected due to age and standing. Inflammation considered due to systemic swelling. No severe pathology suspected. - Ordered knee x-rays at [facility] to rule out arthritis. - Prescribed short course of steroids for inflammation. - Advised daily Allegra for allergy management. - Recommended compression socks for edema. - Scheduled follow-up in 2-3 weeks.  Allergic rhinitis Managed with Allegra. Symptoms  may exacerbate systemic swelling. - Advised daily use of Allegra.         Winfrey Chillemi A. Vita MD [facility] "

## 2024-03-21 NOTE — Patient Instructions (Signed)
 Please go get your xrays done at [facility]. You do not need to make an appointment. You can just show up.   Address: [address] [place], [place], [place] [address]

## 2024-03-21 NOTE — Telephone Encounter (Signed)
" °  FYI Only or Action Required?: FYI only for provider: appointment scheduled on 03/21/24.  Patient was last seen in primary care on 06/02/2022 by Bulah Alm RAMAN, PA-C.  Called Nurse Triage reporting Joint Swelling.  Symptoms began a week ago.  Interventions attempted: OTC medications: Advil, ice.  Symptoms are: unchanged.  Triage Disposition: See PCP When Office is Open (Within 3 Days)  Patient/caregiver understands and will follow disposition?:    Copied from CRM #[id number]. Topic: Clinical - Red Word Triage >> Mar 21, 2024  8:05 AM Berwyn MATSU wrote: Red Word that prompted transfer to Nurse Triage: right hand and knees swelling and stiffness per patient has fluid build up uncomfortable Reason for Disposition  MILD or MODERATE swelling (e.g., can't move joint normally, can't do usual activities) (Exceptions: Itchy, localized swelling; swelling is chronic.)  Answer Assessment - Initial Assessment Questions 1. LOCATION: Where is the swelling located?  (e.g., left, right, both knees)     Both rt > lt 2. ONSET: When did the swelling start? Does it come and go, or is it there all the time?     This week, constant 3. SWELLING: How bad is the swelling? Or, How large is it? (e.g., mild, moderate, severe; size of localized swelling)      Knees only, mild, moving leg normally 4. PAIN: Is there any pain? If Yes, ask: How bad is it? (Scale 0-10; or none, mild, moderate, severe)     no 5. SETTING: Has there been any recent work, exercise or other activity that involved that part of the body?      no 6. AGGRAVATING FACTORS: What makes the knee swelling worse? (e.g., walking, climbing stairs, running)     walking 7. ASSOCIATED SYMPTOMS: Is there any pain or redness?     denies 8. OTHER SYMPTOMS: Do you have any other symptoms? (e.g., calf pain, chest pain, difficulty breathing, fever)     Denies pain, cp, sob, fever  Protocols used: Knee Swelling-A-AH  "

## 2024-03-30 ENCOUNTER — Encounter: Payer: Self-pay | Admitting: Nurse Practitioner

## 2024-04-02 ENCOUNTER — Ambulatory Visit: Admitting: Nurse Practitioner

## 2024-04-02 ENCOUNTER — Encounter: Payer: Self-pay | Admitting: Nurse Practitioner

## 2024-04-02 VITALS — BP 128/80 | HR 72 | Wt 191.8 lb

## 2024-04-02 DIAGNOSIS — M7989 Other specified soft tissue disorders: Secondary | ICD-10-CM | POA: Diagnosis not present

## 2024-04-02 DIAGNOSIS — M25461 Effusion, right knee: Secondary | ICD-10-CM | POA: Diagnosis not present

## 2024-04-02 DIAGNOSIS — M25462 Effusion, left knee: Secondary | ICD-10-CM

## 2024-04-02 NOTE — Progress Notes (Signed)
 "  Leah Estrada Doing, DNP, AGNP-c [facility] [address] [place], [place] [address] [phone]   ACUTE VISIT : Acute or New Concern Visit on 04/02/2024  Blood pressure 128/80, pulse 72, weight 191 lb 12.8 oz (87 kg), last menstrual period 03/05/2021.   Subjective:  other (Swelling, started around dec. 19th, started in lt. Side of lt. Knee, also noticed knee has been stiff, last Friday knees started swelling again, still has swelling in her hands also, rt. Knee feels worse now than the lt. Knee, so took Aleve today, )  History of Present Illness Leah Estrada is a 54 year old female who presents with swelling and pain in her knees and hands.  She has been experiencing swelling and stiffness in her knees and hands, with the right side being more affected. The symptoms began with a sore spot on the side of her left knee, which was tender to touch and associated with stiffness. These symptoms appeared around the end of the year and were exacerbated by activities such as climbing stairs and using the restroom. On Christmas Eve, she experienced significant swelling in her knees, hands, and lips, prompting her to seek medical attention.  She was prescribed prednisone , which she mistakenly took all at once instead of as directed. Despite this, she noted improvement in tenderness and swelling with prednisone  and Aleve, although the swelling in her hands never completely resolved. The swelling is sometimes accompanied by a burning sensation, particularly at night, and her knees feel like they have fluid in them. She has been wearing compression stockings and taking Advil to manage symptoms, especially when anticipating being on her feet for extended periods.  She has a history of thyroid  issues, which have been stable without recent medication changes. She also mentions occasional urinary symptoms, such as cloudy urine, which she associates with menopause. 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  No shortness of breath, palpitations, or significant changes in skin condition, although she reports dry skin. She has not experienced any fever, but has had chills, which she attributes to hot flashes. She also notes a history of a swollen middle finger, which was evaluated by a hand specialist without significant findings.  She has not had a physical in years and primarily visits the gynecologist. 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 ROS negative except for what is listed in HPI. History, Medications, Surgery, SDOH, and Family History reviewed and updated as appropriate.  Objective:  Physical Exam Vitals and nursing note reviewed.  Constitutional:      Appearance: Normal appearance.  HENT:     Head: Normocephalic.  Eyes:     Pupils: Pupils are equal, round, and reactive to light.  Cardiovascular:     Pulses: Normal pulses.  Pulmonary:     Effort: Pulmonary effort is normal.  Musculoskeletal:        General: Swelling and tenderness present.     Cervical back: Normal range of motion.     Comments: Mild edema noted around the knees bilaterally. No erythema, warmth, or effusion present. Edema noted to the palm of the right hand at the base of the thumb. No erythema or warmth present.   Skin:    General: Skin is warm and dry.     Capillary Refill: Capillary refill takes less than 2 seconds.     Findings: No bruising, erythema or rash.  Neurological:     General: No focal deficit present.     Mental Status: She is alert and oriented to person, place, and time.     Sensory: No sensory deficit.     Motor: No weakness.     Coordination: Coordination normal.     Gait: Gait normal.     Deep Tendon Reflexes: Reflexes normal.  Psychiatric:        Mood and Affect: Mood normal.         Assessment & Plan:   Assessment & Plan Bilateral knee swelling Bilateral hand  swelling Bilateral knee and hand swelling, under evaluation for inflammatory arthritis and other etiologies Intermittent swelling and pain in knees and hands, with more fluid in the right knee. Symptoms include stiffness, burning sensation, and difficulty with ring removal. Differential diagnosis includes inflammatory arthritis, dehydration, thyroid  dysfunction, and venous insufficiency. Previous prednisone  use reduced swelling, suggesting an inflammatory component. No evidence of blood clot, heart failure, or kidney dysfunction. Consideration of dietary sodium intake and potential allergens. Compression stockings provide some relief. Aleve provides symptomatic relief but should be used cautiously due to potential liver and stomach effects. - Ordered labs to check kidney function, liver function, thyroid  function, inflammatory markers, and uric acid levels. - Checked urine for proteinuria. - Continue wearing compression stockings as needed. - Use Aleve for pain and swelling as needed, with caution regarding long-term use. - Monitor dietary sodium intake and consider reducing if high. - Will follow up with lab results to determine next steps.  Orders:   Sedimentation rate   CBC with Differential/Platelet   Comprehensive metabolic panel with GFR   TSH   T4, free   Uric acid   Anti-CCP Ab, IgG + IgA (RDL)   Microalbumin / creatinine urine ratio    Leah Estrada Doing, DNP, AGNP-c       "

## 2024-04-03 LAB — MICROALBUMIN / CREATININE URINE RATIO
Creatinine, Urine: 33.5 mg/dL
Microalb/Creat Ratio: <9 mg/g{creat} (ref 0–29)
Microalbumin, Urine: <3 ug/mL

## 2024-04-04 ENCOUNTER — Ambulatory Visit: Payer: Self-pay | Admitting: Nurse Practitioner

## 2024-04-04 LAB — COMPREHENSIVE METABOLIC PANEL WITH GFR
ALT: 25 IU/L (ref 0–32)
AST: 19 IU/L (ref 0–40)
Albumin: 4.4 g/dL (ref 3.8–4.9)
Alkaline Phosphatase: 147 IU/L — ABNORMAL HIGH (ref 49–135)
BUN/Creatinine Ratio: 19 (ref 9–23)
BUN: 17 mg/dL (ref 6–24)
Bilirubin Total: 0.4 mg/dL (ref 0.0–1.2)
CO2: 24 mmol/L (ref 20–29)
Calcium: 9.9 mg/dL (ref 8.7–10.2)
Chloride: 103 mmol/L (ref 96–106)
Creatinine, Ser: 0.9 mg/dL (ref 0.57–1.00)
Globulin, Total: 2.7 g/dL (ref 1.5–4.5)
Glucose: 83 mg/dL (ref 70–99)
Potassium: 4.1 mmol/L (ref 3.5–5.2)
Sodium: 140 mmol/L (ref 134–144)
Total Protein: 7.1 g/dL (ref 6.0–8.5)
eGFR: 76 mL/min/1.73

## 2024-04-04 LAB — CBC WITH DIFFERENTIAL/PLATELET
Basophils Absolute: 0 x10E3/uL (ref 0.0–0.2)
Basos: 0 %
EOS (ABSOLUTE): 0.3 x10E3/uL (ref 0.0–0.4)
Eos: 3 %
Hematocrit: 44.5 % (ref 34.0–46.6)
Hemoglobin: 14.5 g/dL (ref 11.1–15.9)
Immature Grans (Abs): 0 x10E3/uL (ref 0.0–0.1)
Immature Granulocytes: 0 %
Lymphocytes Absolute: 3.4 x10E3/uL — ABNORMAL HIGH (ref 0.7–3.1)
Lymphs: 33 %
MCH: 28.9 pg (ref 26.6–33.0)
MCHC: 32.6 g/dL (ref 31.5–35.7)
MCV: 89 fL (ref 79–97)
Monocytes Absolute: 0.7 x10E3/uL (ref 0.1–0.9)
Monocytes: 7 %
Neutrophils Absolute: 5.9 x10E3/uL (ref 1.4–7.0)
Neutrophils: 57 %
Platelets: 237 x10E3/uL (ref 150–450)
RBC: 5.02 x10E6/uL (ref 3.77–5.28)
RDW: 13.6 % (ref 11.7–15.4)
WBC: 10.5 x10E3/uL (ref 3.4–10.8)

## 2024-04-04 LAB — T4, FREE: Free T4: 1.31 ng/dL (ref 0.82–1.77)

## 2024-04-04 LAB — SEDIMENTATION RATE: Sed Rate: 25 mm/h (ref 0–40)

## 2024-04-04 LAB — URIC ACID: Uric Acid: 5.3 mg/dL (ref 3.0–7.2)

## 2024-04-04 LAB — ANTI-CCP AB, IGG + IGA (RDL): Anti-CCP Ab, IgG + IgA (RDL): <20 U

## 2024-04-04 LAB — TSH: TSH: 2.61 u[IU]/mL (ref 0.450–4.500)

## 2024-04-11 ENCOUNTER — Ambulatory Visit: Admitting: Family Medicine

## 2024-04-14 ENCOUNTER — Other Ambulatory Visit: Payer: Self-pay | Admitting: Nurse Practitioner

## 2024-04-14 DIAGNOSIS — E039 Hypothyroidism, unspecified: Secondary | ICD-10-CM

## 2024-04-16 NOTE — Telephone Encounter (Signed)
 Med refill request: levothyroxine  (synthroid ) 75 mcg tablet Last AEX: 04/14/23 TW Next AEX: 04/23/24 TW Last MMG (if hormonal med) N/A Refill authorized: Please Advise? Last Rx sent #[phone] with 3 refills on 04/19/23 TW

## 2024-04-23 ENCOUNTER — Encounter: Payer: Self-pay | Admitting: Nurse Practitioner

## 2024-04-23 ENCOUNTER — Ambulatory Visit: Admitting: Nurse Practitioner

## 2024-04-24 ENCOUNTER — Ambulatory Visit: Admitting: Nurse Practitioner

## 2024-04-24 ENCOUNTER — Encounter: Payer: Self-pay | Admitting: Nurse Practitioner

## 2024-04-24 VITALS — BP 120/78 | HR 64 | Resp 16 | Ht 60.75 in | Wt 192.0 lb

## 2024-04-24 DIAGNOSIS — Z780 Asymptomatic menopausal state: Secondary | ICD-10-CM | POA: Diagnosis not present

## 2024-04-24 DIAGNOSIS — Z01419 Encounter for gynecological examination (general) (routine) without abnormal findings: Secondary | ICD-10-CM

## 2024-04-24 DIAGNOSIS — E039 Hypothyroidism, unspecified: Secondary | ICD-10-CM

## 2024-04-24 DIAGNOSIS — Z1331 Encounter for screening for depression: Secondary | ICD-10-CM

## 2024-04-24 MED ORDER — LEVOTHYROXINE SODIUM 75 MCG PO TABS: 75.0000 ug | ORAL_TABLET | Freq: Every day | ORAL | 3 refills | Status: AC

## 2024-04-24 NOTE — Progress Notes (Signed)
 "  Leah Estrada 09/16/1970 [id number]   History:  54 y.o. G1P1001 presents for annual exam. Postmenopausal - no HRT. 2015/2016 ASCUS negative HPV with negative biopsy but showed severe acute and chronic cervicitis.  History of hypothyroidism, TSH 2.610 on 04/04/24 when checked by PCP.   Gynecologic History Patient's last menstrual period was 03/05/2021.   Contraception/Family planning: post menopausal status Sexually active: Yes  Health Maintenance Last Pap: 04/14/2023. Results were: Normal neg HPV Last mammogram: 11/07/2023. Results were: Normal Last colonoscopy: 09/22/2022. Results were: Normal Last Dexa: Not indicated     04/24/2024   11:21 AM  Depression screen PHQ 2/9  Decreased Interest 0  Down, Depressed, Hopeless 0  PHQ - 2 Score 0     Past medical history, past surgical history, family history and social history were all reviewed and documented in the EPIC chart. 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 54 yo daughter, was EMT, just passed paramedic exam. Mother and 2 maternal aunts with breast cancer history. Patient declines genetic screening.   ROS:  A ROS was performed and pertinent positives and negatives are included.  Exam:  Vitals:   04/24/24 1112  BP: 120/78  Pulse: 64  Resp: 16  Weight: 192 lb (87.1 kg)  Height: 5' 0.75 (1.543 m)     Body mass index is 36.58 kg/m.  General appearance:  Normal Thyroid :  Symmetrical, normal in size, without palpable masses or nodularity. Respiratory  Auscultation:  Clear without wheezing or rhonchi Cardiovascular  Auscultation:  Regular rate, without rubs, murmurs or gallops  Edema/varicosities:  Not grossly evident Abdominal  Soft,nontender, without masses, guarding or rebound.  Liver/spleen:  No organomegaly noted  Hernia:  None appreciated  Skin  Inspection:  Grossly normal Breasts: Examined lying and sitting.   Right: Without masses, retractions, nipple discharge or axillary adenopathy.   Left: Without masses,  retractions, nipple discharge or axillary adenopathy. Pelvic: External genitalia:  no lesions              Urethra:  normal appearing urethra with no masses, tenderness or lesions              Bartholins and Skenes: normal                 Vagina: normal appearing vagina with normal color and discharge, no lesions. Atrophic changes              Cervix: no lesions Bimanual Exam:  Uterus:  no masses or tenderness              Adnexa: no mass, fullness, tenderness              Rectovaginal: Deferred              Anus:  normal, no lesions  Leah Estrada, CMA present as chaperone.   Assessment/Plan:  54 y.o. G1P1001 for annual exam.   Well female exam with routine gynecological exam - Education provided on SBEs, importance of preventative screenings, current guidelines, high calcium diet, regular exercise, and multivitamin daily. Labs with PCP. Plans to have lipid panel in April with PCP.   Postmenopausal - no HRT, no bleeding  Depression screening - PHQ - 0  Hypothyroidism, unspecified type - Plan: levothyroxine  (SYNTHROID ) 75 MCG tablet daily. Normal TSH a few weeks ago.   Screening for cervical cancer - 2015/2016 ASCUS negative HPV with negative biopsy but showed severe acute and chronic cervicitis.  Will repeat pap at 5-year interval per guidelines.   Screening  for breast cancer - Normal mammogram history.  Continue annual screenings.  Normal breast exam today.  Screening for colon cancer - 08/2022 colonoscopy. Will repeat at GI's recommended interval.   Return in about 1 year (around 04/24/2025) for Annual.    Leah DELENA Shutter DNP, 11:48 AM 04/24/2024 "

## 2024-05-09 ENCOUNTER — Ambulatory Visit: Admitting: Orthopedic Surgery

## 2024-05-16 ENCOUNTER — Ambulatory Visit: Admitting: Orthopedic Surgery

## 2024-07-19 ENCOUNTER — Encounter: Admitting: Family Medicine

## 2025-04-25 ENCOUNTER — Ambulatory Visit: Admitting: Nurse Practitioner
